# Patient Record
Sex: Female | Born: 1985 | Race: White | Hispanic: No | Marital: Single | Smoking: Light tobacco smoker
Health system: Southern US, Community
[De-identification: ages and names within clinical notes are randomized; demographics above are authoritative.]

## PROBLEM LIST (undated history)

## (undated) DIAGNOSIS — R51 Headache: Secondary | ICD-10-CM

## (undated) DIAGNOSIS — T7840XA Allergy, unspecified, initial encounter: Secondary | ICD-10-CM

## (undated) DIAGNOSIS — R112 Nausea with vomiting, unspecified: Secondary | ICD-10-CM

## (undated) DIAGNOSIS — K219 Gastro-esophageal reflux disease without esophagitis: Secondary | ICD-10-CM

## (undated) DIAGNOSIS — C4490 Unspecified malignant neoplasm of skin, unspecified: Secondary | ICD-10-CM

## (undated) DIAGNOSIS — B958 Unspecified staphylococcus as the cause of diseases classified elsewhere: Secondary | ICD-10-CM

## (undated) DIAGNOSIS — Z8619 Personal history of other infectious and parasitic diseases: Secondary | ICD-10-CM

## (undated) DIAGNOSIS — F419 Anxiety disorder, unspecified: Secondary | ICD-10-CM

## (undated) DIAGNOSIS — Z9889 Other specified postprocedural states: Secondary | ICD-10-CM

## (undated) DIAGNOSIS — I10 Essential (primary) hypertension: Secondary | ICD-10-CM

## (undated) HISTORY — DX: Unspecified malignant neoplasm of skin, unspecified: C44.90

## (undated) HISTORY — DX: Allergy, unspecified, initial encounter: T78.40XA

## (undated) HISTORY — DX: Gastro-esophageal reflux disease without esophagitis: K21.9

## (undated) HISTORY — PX: OTHER SURGICAL HISTORY: SHX169

## (undated) HISTORY — DX: Headache: R51

## (undated) HISTORY — PX: GASTRIC ROUX-EN-Y: SHX5262

## (undated) HISTORY — PX: FRACTURE SURGERY: SHX138

## (undated) HISTORY — DX: Personal history of other infectious and parasitic diseases: Z86.19

---

## 2005-10-03 DIAGNOSIS — C4490 Unspecified malignant neoplasm of skin, unspecified: Secondary | ICD-10-CM

## 2005-10-03 HISTORY — DX: Unspecified malignant neoplasm of skin, unspecified: C44.90

## 2010-02-16 ENCOUNTER — Ambulatory Visit: Payer: Self-pay | Admitting: Family

## 2010-02-16 DIAGNOSIS — J45909 Unspecified asthma, uncomplicated: Secondary | ICD-10-CM | POA: Insufficient documentation

## 2010-02-16 DIAGNOSIS — G43009 Migraine without aura, not intractable, without status migrainosus: Secondary | ICD-10-CM | POA: Insufficient documentation

## 2010-02-16 LAB — CONVERTED CEMR LAB
AST: 11 units/L (ref 0–37)
Alkaline Phosphatase: 91 units/L (ref 39–117)
BUN: 19 mg/dL (ref 6–23)
Basophils Absolute: 0 10*3/uL (ref 0.0–0.1)
Basophils Relative: 1 % (ref 0–1)
Calcium: 8.7 mg/dL (ref 8.4–10.5)
Chloride: 108 meq/L (ref 96–112)
Creatinine, Ser: 0.69 mg/dL (ref 0.40–1.20)
Eosinophils Absolute: 0.3 10*3/uL (ref 0.0–0.7)
Eosinophils Relative: 4 % (ref 0–5)
HCT: 41.8 % (ref 36.0–46.0)
HDL: 45 mg/dL (ref >39)
MCHC: 31.8 g/dL (ref 30.0–36.0)
MCV: 89.5 fL (ref 78.0–100.0)
Neutrophils Relative %: 61 % (ref 43–77)
Platelets: 308 10*3/uL (ref 150–400)
RDW: 13.7 % (ref 11.5–15.5)
TSH: 2.225 microintl units/mL (ref 0.350–4.500)
Total Bilirubin: 0.4 mg/dL (ref 0.3–1.2)
Total CHOL/HDL Ratio: 2.7
VLDL: 12 mg/dL (ref 0–40)

## 2010-02-17 ENCOUNTER — Encounter: Payer: Self-pay | Admitting: Family

## 2010-02-23 ENCOUNTER — Encounter: Payer: Self-pay | Admitting: Family

## 2010-03-03 ENCOUNTER — Ambulatory Visit: Payer: Self-pay | Admitting: Family

## 2010-05-19 ENCOUNTER — Ambulatory Visit: Payer: Self-pay | Admitting: Family

## 2010-05-19 DIAGNOSIS — L732 Hidradenitis suppurativa: Secondary | ICD-10-CM

## 2010-08-27 ENCOUNTER — Emergency Department (HOSPITAL_COMMUNITY): Admission: EM | Admit: 2010-08-27 | Discharge: 2010-08-28 | Payer: Self-pay | Admitting: Emergency Medicine

## 2010-11-02 NOTE — Assessment & Plan Note (Signed)
Summary: TO EST /HA/hea   Vital Signs:  Patient profile:   25 year old female Height:      66.5 inches Weight:      242.75 pounds BMI:     38.73 Temp:     98.3 degrees F oral Pulse rate:   78 / minute Pulse rhythm:   regular Resp:     12 per minute BP sitting:   138 / 80  (right arm) Cuff size:   large  Vitals Entered By: Mervin Kung CMA (Feb 16, 2010 8:33 AM) CC: room 4  Intermittent headaches for years. Having more frequent and intense headaches., Headache Is Patient Diabetic? No Comments Pt will be completing the Gardasil vaccine in June, 2011.   CC:  room 4  Intermittent headaches for years. Having more frequent and intense headaches. and Headache.  History of Present Illness: Patient has been going to [facility] previously.    1) Headaches-  notes chronic hx of headaches.  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  Last Monday had severe headache,  Thursday had another severe headache.  Friday no heaache.  Sever headache on Sunday - was in tears, took 4 Aleve without improvement.  Reports "head doesn't feel right."  10AM yesterday developed contstant HA all day.  HA sometime start behind her eyes/temples.  Other times localize behind the forehead.  Sunday had associated nausea with  headache.  + Photophobia.  Denies phonophobia.  Grandmother has history of severe HA's and migraines.     2) Obesity-  has been working out has lost 10 pounds over last 3 months.  Exercising regularly, Wants help with weight loss.  Wants to go to the weight loss center at [facility]  3) Asthma-  recently got prescription coverage.  She notes that she had been using Singulair and Symbicort sparingly and not on a regular basis due to cost.  Approximately 1 month ago she started using regularly due to flare up of her symptoms.  Presently, she reports that she is rarely needing her Albuterol MDI.   Headache HPI:      Headache quality is pressure or tightness.   Aggravating factors include walking up/down stairs and physical activity.        Positive alarm features include change in frequency from prior H/A's, change in severity from prior H/A's, weight loss, and scalp tenderness.  The patient denies first or worst H/A of life, change in features from prior H/A's, H/A's with Valsalva (cough/sneeze), fever, confusion, and seizures.        Preventive Screening-Counseling & Management  Alcohol-Tobacco     Alcohol drinks/day: <1     Alcohol type: wine     Smoking Status: never  Caffeine-Diet-Exercise     Caffeine use/day: 3-4 cups coffee daily     Does Patient Exercise: yes     Type of exercise: cardio, weights     Exercise (avg: min/session): 30-60     Times/week: 3      Drug Use:  no.    Allergies (verified): No Known Drug Allergies  Past History:  Past Medical History: Asthma chicken Pox Frequent Headaches GERD HIV testing--2006 Allergies Skin cancer--2007  Past Surgical History: Fracture right elbow--childhood  Family History: Uterine  Cancer--mother Hypercholesterolemia--both grandfathers HTN--father, both grandfathers diabetes--both grandfathers, mother MI--both grandfathers; deceased  Mom- DM2, ESRD, HTN, Uterine cancer, Carpal Tunnel, overweight Dad- HTN, untreated,  Sister- healthy.  Not overweight  Social History: Single Works at [employer]  [place] (works in [employer]) Never Smoked Alcohol use-no Drug use-no Regular exercise-yes Smoking Status:  never Caffeine use/day:  3-4 cups coffee daily Does Patient Exercise:  yes Drug Use:  no  Review of Systems       Constitutional: Denies Fever ENT:  Denies nasal congestion or sore throat. Resp: Denies cough CV:  Denies Chest Pain GI:  Denies nausea or vomitting GU: Denies dysuria Lymphatic: Denies lymphadenopathy Musculoskeletal:  Denies muscle/joint pain Skin:  Denies Rashes Psychiatric: Denies depression Neuro: Denies numbness     Physical  Exam  General:  Well-developed,well-nourished,in no acute distress; alert,appropriate and cooperative throughout examination Head:  Normocephalic and atraumatic without obvious abnormalities. No apparent alopecia or balding. Eyes:  PERRLA Neck:  No deformities, masses, or tenderness noted. Lungs:  Normal respiratory effort, chest expands symmetrically. Lungs are clear to auscultation, no crackles or wheezes. Heart:  Normal rate and regular rhythm. S1 and S2 normal without gallop, murmur, click, rub or other extra sounds. Neurologic:  alert & oriented X3, cranial nerves II-XII intact, strength normal in all extremities, gait normal, and DTRs symmetrical and normal.     Impression & Recommendations:  Problem # 1:  COMMON MIGRAINE (ICD-346.10) Assessment Unchanged Will give patient trial of Sumatripan.  If no improvement consider referral to Headache Clinic The following medications were removed from the medication list:    Sumatriptan Succinate 50 Mg Tabs (Sumatriptan succinate) ..... One tablet by mouth x 1 at start of migraine.  may repeat in 2 hours if needed Her updated medication list for this problem includes:    Sumatriptan Succinate 50 Mg Tabs (Sumatriptan succinate) .Marland Kitchen... Take one tablet at start of headache, may repeat in 2 hours as needed  Problem # 2:  MORBID OBESITY (ICD-278.01) Assessment: Unchanged  Patient is frustrated with lack of weight loss.  Feels like she has been working hard on diet and exercise.  Feels like she needs help. Will refer to the Weight Loss Clinic.  Plan to check fasing labs today which will include TSH.  Pt to follow up for complete physical in 2 weeks. Will attempt to obtain records from [facility].  Orders: Misc. Referral (Misc. Ref)  Problem # 3:  ASTHMA, MODERATE (ICD-493.90) Assessment: Unchanged Per patient her prescriber recommeded  Advair as this is on their formulary.  Sample given of Advair lot [id number] exp- 06/2021 #2.  Will  start Advair in place of Symbicort.  She is also requesting generic for Singulair.  Rx given for Zafirlukast.  Continue Proair PRN The following medications were removed from the medication list:    Symbicort 160-4.5 Mcg/act Aero (Budesonide-formoterol fumarate) .Marland Kitchen... 2 puffs one to two times daily    Singulair 10 Mg Tabs (Montelukast sodium) .Marland Kitchen... Take 1 tablet by mouth once a day    Proair Hfa 108 (90 Base) Mcg/act Aers (Albuterol sulfate) .Marland Kitchen... 1-2puffs as needed. Her updated medication list for this problem includes:    Zafirlukast 20 Mg Tabs (Zafirlukast) .Marland Kitchen... 0ne tablet by mouth twice daily    Advair Diskus 250-50 Mcg/dose Aepb (Fluticasone-salmeterol) ..... One puff twice daily    Proair Hfa 108 (90 Base) Mcg/act Aers (Albuterol sulfate) .Marland Kitchen... 2 puffs every 6 hours as needed  Complete Medication List: 1)  Prilosec Otc 20 Mg Tbec (Omeprazole magnesium) .... Take 1 tablet by mouth once a day 2)  Depo-provera 150 Mg/ml Susp (Medroxyprogesterone acetate) .Marland Kitchen.. 1 injection every 3 months 3)  Zafirlukast 20 Mg Tabs (Zafirlukast) .... 0ne tablet by mouth twice daily  4)  Advair Diskus 250-50 Mcg/dose Aepb (Fluticasone-salmeterol) .... One puff twice daily 5)  Proair Hfa 108 (90 Base) Mcg/act Aers (Albuterol sulfate) .... 2 puffs every 6 hours as needed 6)  Sumatriptan Succinate 50 Mg Tabs (Sumatriptan succinate) .... Take one tablet at start of headache, may repeat in 2 hours as needed  Other Orders: T-Comprehensive Metabolic Panel ([id number]) T-CBC w/Diff ([id number]) T-Lipid Profile ([id number]) T-TSH ([id number])  Patient Instructions: 1)  Please follow up in 2 weeks for a complete physical.  Come fasting to this appointment. 2)  You will be called about your referral to the Weight Loss Center.   3)  It was a pleasure to meet you, welcome to [place]! Prescriptions: SUMATRIPTAN SUCCINATE 50 MG TABS (SUMATRIPTAN SUCCINATE) take one tablet at start of headache, may repeat in 2 hours  as needed  #6 x 0   Entered and Authorized by:   Lemont Fillers FNP   Signed by:   Lemont Fillers FNP on 02/16/2010   Method used:   [facility] [phone][facility] (retail[facility]       [address]       [place], [place]  [address]       Ph: [phone]       Fax: [phone]   RxID:   [id number] SUMATRIPTAN SUCCINATE 50 MG TABS (SUMATRIPTAN SUCCINATE) one tablet by mouth x 1 at start of migraine.  May repeat in 2 hours if needed  #18 x 0   Entered and Authorized by:   Lemont Fillers FNP   Signed by:   Lemont Fillers FNP on 02/16/2010   Method used:   Electronically to        [facility]* (mail-order)             ,          Ph: [phone]       Fax: [phone]   RxID:   [id number] PROAIR HFA 108 (90 BASE) MCG/ACT AERS (ALBUTEROL SULFATE) 2 puffs every 6 hours as needed  #3 x 1   Entered and Authorized by:   Lemont Fillers FNP   Signed by:   Lemont Fillers FNP on 02/16/2010   Method used:   Electronically to        [facility]* (mail-order)             ,          Ph: [phone]       Fax: [phone]   RxID:   [id number] ADVAIR DISKUS 250-50 MCG/DOSE AEPB (FLUTICASONE-SALMETEROL) one puff twice daily  #3 x 1   Entered and Authorized by:   Lemont Fillers FNP   Signed by:   Lemont Fillers FNP on 02/16/2010   Method used:   Electronically to        [facility]* (mail-order)             ,          Ph: [phone]       Fax: [phone]   RxID:   [id number] ZAFIRLUKAST 20 MG TABS (ZAFIRLUKAST) 0ne tablet by mouth twice daily  #90 x 1   Entered and Authorized by:   Lemont Fillers FNP   Signed by:   Lemont Fillers FNP on 02/16/2010   Method used:   Electronically to  [facility]* (mail-order)             ,          Ph: [phone]       Fax: [phone]   RxID:   [id number]   Current Allergies (reviewed today): No known  allergies    Immunization History:  Influenza Immunization History:    Influenza:  historical (10/05/2009)    Preventive Care Screening  Pap Smear:    Date:  10/05/2009    Results:  normal

## 2010-11-02 NOTE — Assessment & Plan Note (Signed)
Summary: CPX NO PAP/DT   Vital Signs:  Patient profile:   25 year old female Height:      66.5 inches Weight:      239 pounds BMI:     38.14 Temp:     98.7 degrees F oral Pulse rate:   84 / minute Pulse rhythm:   regular Resp:     16 per minute BP sitting:   120 / 66  (right arm) Cuff size:   large  Vitals Entered By: Mervin Kung CMA (March 03, 2010 3:54 PM) CC: room 5   Physical. Is Patient Diabetic? No   CC:  room 5   Physical..  History of Present Illness: Miranda Mccoy is a 25 year old female who presents today for a complete physical.  Last tetanus was greater than 10 years ago.  Patient is exercising daily (cardio 55 minutes total on Eliptical and treadmill).  Patient is eating healthy, has started phentermine throught the weight loss clinic at [facility]. Eating less.  Has lost 4 pounds in the last week. Reports that her asthma is well controlled.  Has had one migraine since last visit which was relieved by sumatripan.  Reports 2 sexual partners in the last 6 months.   Allergies (verified): No Known Drug Allergies  Past History:  Past Medical History: Asthma chicken Pox Frequent Headaches GERD HIV testing--2006 Allergies Skin cancer--2007 [id number]  Review of Systems       Constitutional: Denies Fever ENT:  +clear sinus drainage, denies sore throat. Resp: Denies cough CV:  Denies Chest Pain or SOB GI:  Denies vomitting, mild nausea due to sinus drainage.   GU: Denies dysuria Lymphatic: Denies lymphadenopathy Musculoskeletal:  Denies muscle/joint pain Skin:  Denies Rashes Psychiatric: Denies depression,  notes some anxiety- but does does not wish to start med for this Neuro: Denies numbness     Physical Exam  General:  Well-developed,well-nourished,in no acute distress; alert,appropriate and cooperative throughout examination Head:  Normocephalic and atraumatic without obvious abnormalities. No apparent alopecia or balding. Eyes:   Perrla Ears:  External ear exam shows no significant lesions or deformities.  Otoscopic examination reveals clear canals, tympanic membranes are intact bilaterally without bulging, retraction, inflammation or discharge. Hearing is grossly normal bilaterally. Mouth:  Oral mucosa and oropharynx without lesions or exudates.  Teeth in good repair. Neck:  No deformities, masses, or tenderness noted. Breasts:  deferred to GYN Lungs:  Normal respiratory effort, chest expands symmetrically. Lungs are clear to auscultation, no crackles or wheezes. Heart:  Normal rate and regular rhythm. S1 and S2 normal without gallop, murmur, click, rub or other extra sounds. Abdomen:  Bowel sounds positive,abdomen soft and non-tender without masses, organomegaly or hernias noted. Genitalia:  deferred to GYN Msk:  No deformity or scoliosis noted of thoracic or lumbar spine.   Pulses:  R and L carotid,radial,femoral,dorsalis pedis and posterior tibial pulses are full and equal bilaterally Extremities:  No clubbing, cyanosis, edema, or deformity noted with normal full range of motion of all joints.   Neurologic:  No cranial nerve deficits noted. Station and gait are normal. Plantar reflexes are down-going bilaterally. DTRs are symmetrical throughout. Sensory, motor and coordinative functions appear intact. Skin:  + hemagioma noted on left anterior arm.  Hirsuit Cervical Nodes:  No lymphadenopathy noted Psych:  Cognition and judgment appear intact. Alert and cooperative with normal attention span and concentration. No apparent delusions, illusions, hallucinations   Impression & Recommendations:  Problem # 1:  PREVENTIVE HEALTH  CARE (ICD-V70.0) Assessment Comment Only Patient encouraged to keep up the good work with diet and exercise.  Immunizations reviewed,  patient counseled on BSE. Reviewed laboratory results with the patient.  Up to date on Pap smear.  Patient follows with GYN.   Complete Medication List: 1)   Prilosec Otc 20 Mg Tbec (Omeprazole magnesium) .... Take 1 tablet by mouth once a day 2)  Depo-provera 150 Mg/ml Susp (Medroxyprogesterone acetate) .Marland Kitchen.. 1 injection every 3 months 3)  Zafirlukast 20 Mg Tabs (Zafirlukast) .... 0ne tablet by mouth twice daily 4)  Advair Diskus 250-50 Mcg/dose Aepb (Fluticasone-salmeterol) .... One puff twice daily 5)  Proair Hfa 108 (90 Base) Mcg/act Aers (Albuterol sulfate) .... 2 puffs every 6 hours as needed 6)  Sumatriptan Succinate 50 Mg Tabs (Sumatriptan succinate) .... Take one tablet at start of headache, may repeat in 2 hours as needed 7)  Phentermine Hcl 37.5 Mg Tabs (Phentermine hcl) .... Take 1 tablet by mouth once a day  Patient Instructions: 1)  Keep up the good work with the diet and exercise.   2)  Follow up in 3 months.  Current Allergies (reviewed today): No known allergies

## 2010-11-02 NOTE — Consult Note (Signed)
Summary: [facility]  [facility]   Imported By: Lanelle Bal 03/31/2010 08:16:30  _____________________________________________________________________  External Attachment:    Type:   Image     Comment:   External Document

## 2010-11-02 NOTE — Letter (Signed)
   [place] [facility] [address] [place], [place]  [address]  [place] Phone: [phone]      Feb 17, 2010   [facility] [address] [place], [place] [address]  RE:  LAB RESULTS  Dear  Miranda Mccoy,  The following is an interpretation of your most recent lab tests.  Please take note of any instructions provided or changes to medications that have resulted from your lab work.  ELECTROLYTES:  Good - no changes needed  KIDNEY FUNCTION TESTS:  Good - no changes needed  LIVER FUNCTION TESTS:  Good - no changes needed  LIPID PANEL:  Good - no changes needed Triglyceride: 61   Cholesterol: 122   LDL: 65   HDL: 45   Chol/HDL%:  2.7 Ratio  THYROID STUDIES:  Thyroid studies normal TSH: 2.225     DIABETIC STUDIES:  Excellent - no changes needed Blood Glucose: 83    CBC:  Good - no changes needed  Please follow up in June as scheduled for your physical.     Sincerely Yours,    Lemont Fillers FNP

## 2010-11-02 NOTE — Assessment & Plan Note (Signed)
Summary: lump under her arm/mhf   Vital Signs:  Patient profile:   25 year old female Weight:      224.75 pounds BMI:     35.86 O2 Sat:      99 % on Room air Temp:     97.8 degrees F oral Pulse rate:   109 / minute Pulse rhythm:   regular Resp:     20 per minute BP sitting:   134 / 90  (right arm) Cuff size:   large  Vitals Entered By: Miranda Mccoy CMA (May 19, 2010 4:09 PM)  O2 Flow:  Room air CC: Lump under right arm Is Patient Diabetic? No Pain Assessment Patient in pain? no      Comments quarter size lump under right arm, has gone down some, denies temp, feels like a bruise to touch, present for the past week and a half   CC:  Lump under right arm.  History of Present Illness: Miranda Mccoy is a 25 year old female who presents with a 10 day history of tender "bump" under her right arm.  Denies associated drainage, denies fever.  Preventive Screening-Counseling & Management  Alcohol-Tobacco     Smoking Status: never  Allergies (verified): No Known Drug Allergies  Physical Exam  General:  Well-developed,well-nourished,in no acute distress; alert,appropriate and cooperative throughout examination Skin:  Tender, firm, marble sized nodule noted in right axilla, no drainage, no open area.  Psych:  Oriented X3 and memory intact for recent and remote.     Impression & Recommendations:  Problem # 1:  HIDRADENITIS (ICD-705.83) Assessment New Will treat with doxycycline.  Patient instructed to f/u as noted in instructions.  Complete Medication List: 1)  Prilosec Otc 20 Mg Tbec (Omeprazole magnesium) .... Take 1 tablet by mouth once a day 2)  Depo-provera 150 Mg/ml Susp (Medroxyprogesterone acetate) .Marland Kitchen.. 1 injection every 3 months 3)  Zafirlukast 20 Mg Tabs (Zafirlukast) .... 0ne tablet by mouth twice daily 4)  Advair Diskus 250-50 Mcg/dose Aepb (Fluticasone-salmeterol) .... One puff twice daily 5)  Proair Hfa 108 (90 Base) Mcg/act Aers (Albuterol sulfate) .... 2  puffs every 6 hours as needed 6)  Sumatriptan Succinate 50 Mg Tabs (Sumatriptan succinate) .... Take one tablet at start of headache, may repeat in 2 hours as needed 7)  Phentermine Hcl 37.5 Mg Tabs (Phentermine hcl) .... Take 1 tablet by mouth once a day 8)  Hydrochlorothiazide 25 Mg Tabs (Hydrochlorothiazide) .... Take 1 tablet by mouth once a day 9)  Doxycycline Hyclate 100 Mg Caps (Doxycycline hyclate) .... One cap by mouth two times a day x 7 days  Patient Instructions: 1)  Please call if your symptoms worsen or if it does not improve.   Prescriptions: DOXYCYCLINE HYCLATE 100 MG CAPS (DOXYCYCLINE HYCLATE) one cap by mouth two times a day x 7 days  #14 x 0   Entered and Authorized by:   Miranda Fillers FNP   Signed by:   Miranda Fillers FNP on 05/19/2010   Method used:   [facility]        [facility] [phone]* (retail)       [address]       [place], [place]  [address]       Ph: [phone]       Fax: [phone]   RxID:   [id number]     Current Allergies (reviewed today): No known allergies

## 2010-12-28 ENCOUNTER — Encounter: Payer: Self-pay | Admitting: Family

## 2010-12-28 ENCOUNTER — Ambulatory Visit (INDEPENDENT_AMBULATORY_CARE_PROVIDER_SITE_OTHER): Payer: 59 | Admitting: Family

## 2010-12-28 DIAGNOSIS — G43909 Migraine, unspecified, not intractable, without status migrainosus: Secondary | ICD-10-CM

## 2010-12-28 DIAGNOSIS — G43009 Migraine without aura, not intractable, without status migrainosus: Secondary | ICD-10-CM

## 2010-12-28 MED ORDER — SUMATRIPTAN SUCCINATE 50 MG PO TABS: ORAL_TABLET | ORAL | Status: DC

## 2010-12-28 MED ORDER — TOPIRAMATE 25 MG PO TABS: 25.0000 mg | ORAL_TABLET | Freq: Two times a day (BID) | ORAL | Status: DC

## 2010-12-28 NOTE — Patient Instructions (Signed)
Please call if your symptoms worsen or do not improve.  Follow up in 2 weeks.

## 2010-12-28 NOTE — Progress Notes (Signed)
  Subjective:    Patient ID: Miranda Mccoy, female    DOB: July 17, 1986, 25 y.o.   MRN: [id number]  HPI HA- imitrex has dulled her HA.  3 week hx of frontal headaches.  Sharp pains.  Affecting her job.  Last night headache was so bad, that "I almost went to the emergency room."  Normally I just deal with it.  + associated photophobia, + phonophobia.  +associated nausea- no vomitting.  She is still on depo.  Recently started back on phentermine.    Review of Systems See HPI    Objective:   Physical Exam  Constitutional: She is oriented to person, place, and time. She appears well-developed and well-nourished.  Eyes: Pupils are equal, round, and reactive to light.  Cardiovascular: Normal rate and regular rhythm.   Pulmonary/Chest: Effort normal and breath sounds normal.  Neurological: She is alert and oriented to person, place, and time. She displays normal reflexes. She exhibits normal muscle tone. Coordination normal.          Assessment & Plan:

## 2010-12-30 ENCOUNTER — Telehealth: Payer: Self-pay | Admitting: *Deleted

## 2010-12-30 NOTE — Telephone Encounter (Signed)
Received voice message from pt stating imitrex is not getting rid of headache, feels miserable, can't focus at work. Has had HA since 12/24/10. Called on call nurse last night. Wants to know what else can be done?  Please advise.  Triage Record Num: [id number] Operator: Tomasita Crumble Patient Name: Miranda Mccoy Call Date & Time: 12/29/2010 9:51:24PM Patient Phone: [phone] PCP: Sandford Craze, NP Patient Gender: Female PCP Fax : [phone] Patient DOB: May 16, 1986 Practice Name: [facility] [place] [facility] Reason for Call: Pt. calling about headache "coming in waves". Onset 3/28 am - "head did not feel right"; continuous dull headache onset 3/23. Pain up to 7 of 10, currently 3-4 of 10. LMP - on depoprovera; unknown. Advised see MD in 24 hours per Headache protocol, home measures for the interim. Protocol(s) Used: Headache Recommended Outcome per Protocol: See Provider within 24 hours Reason for Outcome: Typical headache AND usual therapy is not available or is not working Care Advice: ~ Another adult should drive. ~ Avoid known causes and factors that trigger headaches. ~ Do not skip or delay meals, unless vomiting. ~ Call provider if symptoms worsen or new symptoms develop. Call EMS 911 immediately if any of the following occur: any loss of consciousness; new confusion, drowsiness or agitation; difficulty speaking; new weakness or paralysis, severe numbness, or difficulty moving. ~ ~ List, or take, all current prescription(s), nonprescription or alternative medication(s) to provider for evaluation. Most adults need to drink 6-10 eight-ounce glasses (1.2-2.0 liters) of fluids per day unless previously told to limit fluid intake for other medical reasons. Limit fluids that contain caffeine, sugar or alcohol. Urine will be a very light yellow color when you drink enough fluids. ~ Analgesic/Antipyretic Advice - Acetaminophen: Consider acetaminophen as directed on label or by  pharmacist/provider for pain or fever PRECAUTIONS: - Use if there is no history of liver disease, alcoholism, or intake of three or more alcohol drinks per day - Only if approved by provider during pregnancy or when breastfeeding - During pregnancy, acetaminophen should not be taken more than 3 consecutive days without telling provider - Do not exceed recommended dose or frequency ~ Analgesic/Antipyretic Advice - NSAIDs: Consider aspirin, ibuprofen, naproxen or ketoprofen for pain or fever as directed on label or by pharmacist/provider. PRECAUTIONS: - If over 64 years of age, should not take longer than 1 week without consulting provider. EXCEPTIONS: - Should not be used if taking blood thinners or have bleeding problems. - Do not use if have history of sensitivity/allergy to any of these medications; or history of cardiovascular, ulcer, kidney, liver disease or diabetes unless approved by provider. - Do not exceed recommended dose or frequency. ~ Migraine Self Care: - At the first sign of a migraine apply a cold cloth or cloth-covered ice pack to your head or the back of the neck. - Apply pressure or massage scalp and temples. - Lie down in a quiet, dark room for several hours. - Minimize noise, light, and odors, especially cooking and tobacco odors. - Do not read or use a computer.

## 2010-12-31 ENCOUNTER — Telehealth: Payer: Self-pay | Admitting: *Deleted

## 2010-12-31 ENCOUNTER — Telehealth: Payer: Self-pay | Admitting: Family

## 2010-12-31 DIAGNOSIS — G43919 Migraine, unspecified, intractable, without status migrainosus: Secondary | ICD-10-CM

## 2010-12-31 MED ORDER — TRAMADOL HCL 50 MG PO TABS: 50.0000 mg | ORAL_TABLET | Freq: Four times a day (QID) | ORAL | Status: AC | PRN

## 2010-12-31 NOTE — Telephone Encounter (Signed)
Yes tramadol is in addition to her to her topamax and imitrex.  Per protocol, we should repeat her pregnancy test prior to CT.

## 2010-12-31 NOTE — Telephone Encounter (Signed)
Received voicemail from pt that Tramadol rx went to [facility] in error; [facility] [address]. [facility] [place] [facility] per previous phone note. Detailed voice message left on pt's machine regarding correction and to call if any questions.

## 2010-12-31 NOTE — Telephone Encounter (Signed)
Please call patient and let her know that I have sent rx to her pharmacy for tramadol.  (do not drive while taking that med.)  I have also placed order for a CT of her head and [facility].  She will need nurse visit for hcg check prior to CT scan.

## 2010-12-31 NOTE — Assessment & Plan Note (Signed)
Will give patient a trial of Topamax.  Continue with imitrex PRN.  If no improvement- consider referral to headache specialist.

## 2010-12-31 NOTE — Telephone Encounter (Signed)
Advised pt per Melissa's instructions. Pt wants to know if the Tramadol is in addition to her other migraine meds or does it replace one? Also states she had a negative home pregnancy test last week. Wants to know if it is still necessary to due prior to the CT scan? Please advise.

## 2010-12-31 NOTE — Telephone Encounter (Signed)
Advised pt per Melissa's instruction. Pt voices understanding and has been advised that she needs to have CT scan before next Friday.

## 2011-01-03 ENCOUNTER — Ambulatory Visit
Admission: RE | Admit: 2011-01-03 | Discharge: 2011-01-03 | Disposition: A | Discharge status: Home / Self Care | Payer: 59 | Source: Ambulatory Visit | Attending: Family | Admitting: Family

## 2011-01-03 ENCOUNTER — Telehealth: Payer: Self-pay | Admitting: Family

## 2011-01-03 ENCOUNTER — Other Ambulatory Visit: Payer: 59

## 2011-01-03 DIAGNOSIS — G43919 Migraine, unspecified, intractable, without status migrainosus: Secondary | ICD-10-CM

## 2011-01-03 NOTE — Telephone Encounter (Signed)
Please call patient and let her know that her MRI is normal.

## 2011-01-03 NOTE — Telephone Encounter (Signed)
Pt.notified

## 2011-01-17 ENCOUNTER — Ambulatory Visit: Payer: 59 | Admitting: Family

## 2014-08-01 DIAGNOSIS — N898 Other specified noninflammatory disorders of vagina: Secondary | ICD-10-CM | POA: Insufficient documentation

## 2014-08-01 DIAGNOSIS — N760 Acute vaginitis: Secondary | ICD-10-CM

## 2014-08-01 DIAGNOSIS — B9689 Other specified bacterial agents as the cause of diseases classified elsewhere: Secondary | ICD-10-CM | POA: Insufficient documentation

## 2014-08-01 DIAGNOSIS — R3915 Urgency of urination: Secondary | ICD-10-CM | POA: Insufficient documentation

## 2014-08-01 DIAGNOSIS — R350 Frequency of micturition: Secondary | ICD-10-CM | POA: Insufficient documentation

## 2016-02-01 DIAGNOSIS — E042 Nontoxic multinodular goiter: Secondary | ICD-10-CM | POA: Insufficient documentation

## 2016-12-07 ENCOUNTER — Emergency Department (HOSPITAL_COMMUNITY)
Admission: EM | Admit: 2016-12-07 | Discharge: 2016-12-07 | Disposition: A | Discharge status: Home / Self Care | Payer: 59 | Attending: Emergency Medicine | Admitting: Emergency Medicine

## 2016-12-07 ENCOUNTER — Emergency Department (HOSPITAL_COMMUNITY): Payer: 59

## 2016-12-07 ENCOUNTER — Encounter (HOSPITAL_COMMUNITY): Payer: Self-pay

## 2016-12-07 DIAGNOSIS — F17210 Nicotine dependence, cigarettes, uncomplicated: Secondary | ICD-10-CM | POA: Diagnosis not present

## 2016-12-07 DIAGNOSIS — Z85828 Personal history of other malignant neoplasm of skin: Secondary | ICD-10-CM | POA: Insufficient documentation

## 2016-12-07 DIAGNOSIS — Z79899 Other long term (current) drug therapy: Secondary | ICD-10-CM | POA: Diagnosis not present

## 2016-12-07 DIAGNOSIS — R9431 Abnormal electrocardiogram [ECG] [EKG]: Secondary | ICD-10-CM | POA: Diagnosis not present

## 2016-12-07 DIAGNOSIS — J45909 Unspecified asthma, uncomplicated: Secondary | ICD-10-CM | POA: Diagnosis not present

## 2016-12-07 HISTORY — DX: Anxiety disorder, unspecified: F41.9

## 2016-12-07 LAB — CBC
HCT: 43.5 % (ref 36.0–46.0)
HEMOGLOBIN: 14.5 g/dL (ref 12.0–15.0)
MCH: 30.8 pg (ref 26.0–34.0)
MCHC: 33.3 g/dL (ref 30.0–36.0)
MCV: 92.4 fL (ref 78.0–100.0)
PLATELETS: 249 10*3/uL (ref 150–400)
RBC: 4.71 MIL/uL (ref 3.87–5.11)
RDW: 13.6 % (ref 11.5–15.5)
WBC: 6.7 10*3/uL (ref 4.0–10.5)

## 2016-12-07 LAB — BASIC METABOLIC PANEL
Anion gap: 10 (ref 5–15)
BUN: 12 mg/dL (ref 6–20)
CALCIUM: 8.9 mg/dL (ref 8.9–10.3)
CHLORIDE: 104 mmol/L (ref 101–111)
CO2: 25 mmol/L (ref 22–32)
CREATININE: 0.75 mg/dL (ref 0.44–1.00)
GFR calc non Af Amer: >60 mL/min (ref >60)
Glucose, Bld: 90 mg/dL (ref 65–99)
Potassium: 4 mmol/L (ref 3.5–5.1)
SODIUM: 139 mmol/L (ref 135–145)

## 2016-12-07 LAB — I-STAT TROPONIN, ED: TROPONIN I, POC: 0 ng/mL (ref 0.00–0.08)

## 2016-12-07 MED ORDER — MAGNESIUM SULFATE 2 GM/50ML IV SOLN
2.0000 g | Freq: Once | INTRAVENOUS | Status: AC
  Administered 2016-12-07: 2 g via INTRAVENOUS
  Filled 2016-12-07: qty 50

## 2016-12-07 NOTE — ED Triage Notes (Signed)
Pt. Coming from [facility] PCP office for abnormal EKG. Pt. Went in for check up on her weight loss program. Pt. Taking phenteramine? For weight loss and took a couple pills this weekend. RN noticed pt. Pulse low and did EKG. EKG noted 5-6 beat runs of vtach. Pt. Converted to NSR en route with no interventions. Pt. Also c/o dizziness intermittently since November. Pt. Aox4 and denies any chest pains.

## 2016-12-07 NOTE — ED Notes (Signed)
Patient transported to x-ray. ?

## 2016-12-07 NOTE — ED Notes (Signed)
Pt HR normal and pt stable for discharge, will follow up with cardiology

## 2016-12-07 NOTE — ED Provider Notes (Signed)
[facility] DEPT Provider Note   CSN: [id number] Arrival date & time: 12/07/16  1156     History   Chief Complaint No chief complaint on file.   HPI Miranda Mccoy is a 31 y.o. female.  HPI 31 y.o. female , presents to the Emergency Department today due to abnormal ECG at PCP office for routine weight loss assessment. Noted V Tach 5-6 runs in office on ECG. No hx same. Spontaneously converted en route to ED with EMS. No meds given. Pt notes mild chest pressure. No SOB. No N/V/D. No diaphoresis. Notes intermittent dizziness and lightheadedness x several months since starting on phentermine. No fevers. No URI symptoms. No pain currently. No other symptoms noted.   Past Medical History:  Diagnosis Date  . Allergy   . Asthma   . GERD (gastroesophageal reflux disease)   . Headache   . History of chicken pox   . Skin cancer 2007    Patient Active Problem List   Diagnosis Date Noted  . HIDRADENITIS 05/19/2010  . MORBID OBESITY 02/16/2010  . COMMON MIGRAINE 02/16/2010  . ASTHMA, MODERATE 02/16/2010    Past Surgical History:  Procedure Laterality Date  . FRACTURE SURGERY     elbow as child    OB History    No data available       Home Medications    Prior to Admission medications   Medication Sig Start Date End Date Taking? Authorizing Provider  albuterol (PROAIR HFA) 108 (90 BASE) MCG/ACT inhaler Inhale 2 puffs into the lungs every 6 (six) hours as needed.      Historical Provider, MD  budesonide-formoterol (SYMBICORT) 160-4.5 MCG/ACT inhaler Inhale 1 puff into the lungs 2 (two) times daily.      Historical Provider, MD  hydrochlorothiazide 25 MG tablet Take 25 mg by mouth daily.      Historical Provider, MD  medroxyPROGESTERone (DEPO-PROVERA) 150 MG/ML injection Inject 150 mg into the muscle every 3 (three) months.      Historical Provider, MD  montelukast (SINGULAIR) 10 MG tablet Take 10 mg by mouth daily.      Historical Provider, MD  omeprazole (PRILOSEC OTC)  20 MG tablet Take 20 mg by mouth daily.      Historical Provider, MD  phentermine 37.5 MG capsule Take 37.5 mg by mouth daily.      Historical Provider, MD  SUMAtriptan (IMITREX) 50 MG tablet One tablet by mouth at start of migraine.  You may repeat in 2 hours once in 24 hours if headache returns or is not improved 12/28/10   Debbrah Alar, NP  topiramate (TOPAMAX) 25 MG tablet Take 1 tablet (25 mg total) by mouth 2 (two) times daily. 12/28/10   Debbrah Alar, NP    Family History Family History  Problem Relation Age of Onset  . Cancer Mother     uterine  . Diabetes Mother   . Kidney disease Mother     esrd  . Carpal tunnel syndrome Mother   . Hypertension Father   . Hyperlipidemia Maternal Grandfather   . Hypertension Maternal Grandfather   . Diabetes Maternal Grandfather   . Heart attack Maternal Grandfather   . Hyperlipidemia Paternal Grandfather   . Hypertension Paternal Grandfather   . Diabetes Paternal Grandfather   . Heart attack Paternal Grandfather     Social History Social History  Substance Use Topics  . Smoking status: Current Some Day Smoker    Types: Cigarettes  . Smokeless tobacco: Never Used  Comment: 3-5 cigarettes a week.  . Alcohol use No     Allergies   Patient has no known allergies.   Review of Systems Review of Systems ROS reviewed and all are negative for acute change except as noted in the HPI.  Physical Exam Updated Vital Signs BP (!) 149/101   Pulse 82   Temp 98 F (36.7 C) (Oral)   Resp 16   Ht 5\' 6"  (1.676 m)   Wt 104.8 kg   SpO2 100%   BMI 37.28 kg/m   Physical Exam  Constitutional: She is oriented to person, place, and time. Vital signs are normal. She appears well-developed and well-nourished.  HENT:  Head: Normocephalic and atraumatic.  Right Ear: Hearing normal.  Left Ear: Hearing normal.  Eyes: Conjunctivae and EOM are normal. Pupils are equal, round, and reactive to light.  Neck: Normal range of motion.  Neck supple.  Cardiovascular: Normal rate, regular rhythm, normal heart sounds and intact distal pulses.   Pulmonary/Chest: Effort normal and breath sounds normal.  Abdominal: Soft. Bowel sounds are normal. There is no tenderness.  Musculoskeletal: Normal range of motion.  Neurological: She is alert and oriented to person, place, and time.  Skin: Skin is warm and dry.  Psychiatric: She has a normal mood and affect. Her speech is normal and behavior is normal. Thought content normal.  Nursing note and vitals reviewed.  ED Treatments / Results  Labs (all labs ordered are listed, but only abnormal results are displayed) Labs Reviewed  [place], ED    EKG  EKG Interpretation None      Radiology Dg Chest 2 View  Result Date: 12/07/2016 CLINICAL DATA:  Abnormal EKG EXAM: CHEST  2 VIEW COMPARISON:  None. FINDINGS: The heart size and mediastinal contours are within normal limits. Both lungs are clear. The visualized skeletal structures are unremarkable. IMPRESSION: No active cardiopulmonary disease. Electronically Signed   By: Inez Catalina M.D.   On: 12/07/2016 13:01    Procedures Procedures (including critical care time)  Medications Ordered in ED Medications - No data to display   Initial Impression / Assessment and Plan / ED Course  I have reviewed the triage vital signs and the nursing notes.  Pertinent labs & imaging results that were available during my care of the patient were reviewed by me and considered in my medical decision making (see chart for details).  Final Clinical Impressions(s) / ED Diagnoses  {I have reviewed and evaluated the relevant laboratory values. {I have reviewed and evaluated the relevant imaging studies. {I have interpreted the relevant EKG. {I have reviewed the relevant previous healthcare records.  {I obtained HPI from historian. {Patient discussed with supervising physician.  ED Course:  Assessment: Pt is a  31 y.o. female who presents with abnormal ECG from PCP office for routine weight loss follow up. Noted Vtach on office ECG. Converted via EMS without intervention. No CP/SOB/ABD pain. Notes mild chest pressure that is intermittent. Notes hx same in past. Intermittent dizziness with lightheadedness since being started on Phentermine for weight loss. On exam, pt in NAD. Nontoxic/nonseptic appearing. VSS. Afebrile. Lungs CTA. Heart RRR. Abdomen nontender soft. ECG from office notes intermittent Vtach. EKG in ED unremarkable. QtC prolonged at 469. Given 2g Magnesium. CBC unremarkable. BMP unremarkable. Trop negative. CXR unremarkable. Plan is to DC home with outpatient follow up with Cardiology. Notified Cardiology who will call and schedule appointment. At time of discharge, Patient is in no acute  distress. Vital Signs are stable. Patient is able to ambulate. Patient able to tolerate PO.   Disposition/Plan:  DC Home Additional Verbal discharge instructions given and discussed with patient.  Pt Instructed to f/u with Cardiology in the next week for evaluation and treatment of symptoms. Return precautions given Pt acknowledges and agrees with plan  Supervising Physician Deno Etienne, DO  Final diagnoses:  Abnormal EKG    New Prescriptions New Prescriptions   No medications on file     Shary Decamp, PA-C 12/07/16 [place], DO 12/07/16 [phone]

## 2016-12-07 NOTE — Discharge Instructions (Signed)
Please read and follow all provided instructions.  Your diagnoses today include:  1. Abnormal EKG     Tests performed today include: An EKG of your heart A chest x-ray Cardiac enzymes - a blood test for heart muscle damage Blood counts and electrolytes Vital signs. See below for your results today.   Medications prescribed:   Take any prescribed medications only as directed.  Follow-up instructions: Please follow-up with your primary care provider as soon as you can for further evaluation of your symptoms.   Return instructions:  SEEK IMMEDIATE MEDICAL ATTENTION IF: You have severe chest pain, especially if the pain is crushing or pressure-like and spreads to the arms, back, neck, or jaw, or if you have sweating, nausea (feeling sick to your stomach), or shortness of breath. THIS IS AN EMERGENCY. Don't wait to see if the pain will go away. Get medical help at once. Call 911 or 0 (operator). DO NOT drive yourself to the hospital.  Your chest pain gets worse and does not go away with rest.  You have an attack of chest pain lasting longer than usual, despite rest and treatment with the medications your caregiver has prescribed.  You wake from sleep with chest pain or shortness of breath. You feel dizzy or faint. You have chest pain not typical of your usual pain for which you originally saw your caregiver.  You have any other emergent concerns regarding your health.  Additional Information: Chest pain comes from many different causes. Your caregiver has diagnosed you as having chest pain that is not specific for one problem, but does not require admission.  You are at low risk for an acute heart condition or other serious illness.   Your vital signs today were: BP (!) 149/101    Pulse 82    Temp 98 F (36.7 C) (Oral)    Resp 16    Ht 5\' 6"  (1.676 m)    Wt 104.8 kg    SpO2 100%    BMI 37.28 kg/m  If your blood pressure (BP) was elevated above 135/85 this visit, please have this  repeated by your doctor within one month. --------------

## 2016-12-08 NOTE — Progress Notes (Signed)
Cardiology Office Note   Date:  12/10/2016   ID:  [facility] [place], DOB [birth year], MRN [id number]  PCP:  Nance Pear., NP  Cardiologist:   Minus Breeding, MD  Referring:  Nance Pear., NP  Chief Complaint  Patient presents with  . Palpitations      History of Present Illness: Miranda Mccoy is a 31 y.o. female who presents for evaluation of ventricular tachycardia.  She had NSVT.   She was seen in the ED and I evaluated these records for this appt.  She was treated with magnesium.  EKG demonstrated a very mildly prolonged QTc .  She has no past cardiac history. She went to see her primary care doctor talked about back pain. She actually taken a couple days worth of phentermine had in the past.  She was noted to have a rapid heart rate. I don't have that EKG. However, EMS was called because of his wide complex. Do have his rhythm strips. She had a wide complex runs of NSVT with a left bundle branch morphology. In retrospect she says she's always had kind of a nervous feeling in her chest. This comes and goes but she does have a history of anxiety. She's been lightheaded little bit upon standing but has not had any presyncope. She can't bring the symptoms on and they seem to happen at rest but can happen with activity. She does do some exercising. She drinks 2 or 3 cups of coffee daily. She does not have any chest pressure, neck or arm discomfort. She doesn't describe shortness of breath, PND or orthopnea. Her weight fluctuates. She's had no new edema. She does have a stressful job with lots of responsibility.   Past Medical History:  Diagnosis Date  . Allergy   . Anxiety   . Asthma   . GERD (gastroesophageal reflux disease)   . Headache(784.0)   . History of chicken pox   . Skin cancer 2007   Squamous Cell    Past Surgical History:  Procedure Laterality Date  . FRACTURE SURGERY     elbow as child     Current Outpatient Prescriptions  Medication Sig  Dispense Refill  . albuterol (PROAIR HFA) 108 (90 BASE) MCG/ACT inhaler Inhale 2 puffs into the lungs every 6 (six) hours as needed.      . Biotin w/ Vitamins C & E (HAIR SKIN & NAILS GUMMIES PO) Take 2 each by mouth daily.    . budesonide-formoterol (SYMBICORT) 160-4.5 MCG/ACT inhaler Inhale 1 puff into the lungs 2 (two) times daily.      Marland Kitchen ibuprofen (ADVIL,MOTRIN) 200 MG tablet Take 800 mg by mouth every 6 (six) hours as needed for moderate pain.    Marland Kitchen levonorgestrel (MIRENA) 20 MCG/24HR IUD 1 Intra Uterine Device by Intrauterine route once.    . montelukast (SINGULAIR) 10 MG tablet Take 10 mg by mouth daily.      . Multiple Vitamins-Minerals (ADULT ONE DAILY GUMMIES) CHEW Chew 2 each by mouth daily.    Marland Kitchen omeprazole (PRILOSEC) 40 MG capsule Take 40 mg by mouth 2 (two) times daily.    . verapamil (CALAN-SR) 120 MG CR tablet Take 1 tablet (120 mg total) by mouth at bedtime. 30 tablet 0   No current facility-administered medications for this visit.     Allergies:   Other    Social History:  The patient  reports that she has been smoking Cigarettes.  She has never used smokeless tobacco. She reports that  she uses drugs, including Marijuana. She reports that she does not drink alcohol.   Family History:  The patient's family history includes Anxiety disorder in her sister; Cancer in her mother; Carpal tunnel syndrome in her mother; Diabetes in her maternal grandfather, mother, and paternal grandfather; Heart attack in her maternal grandfather and paternal grandfather; Hyperlipidemia in her maternal grandfather and paternal grandfather; Hypertension in her father, maternal grandfather, and paternal grandfather; Kidney disease in her mother; Suicidality in her father.    ROS:  Please see the history of present illness.   Otherwise, review of systems are positive for none.   All other systems are reviewed and negative.    PHYSICAL EXAM: VS:  BP 140/90 (BP Location: Right Arm, Patient Position:  Sitting, Cuff Size: Large)   Pulse 76   Ht 5\' 8"  (1.727 m)   Wt 235 lb 9.6 oz (106.9 kg)   SpO2 98%   BMI 35.82 kg/m  , BMI Body mass index is 35.82 kg/m. GENERAL:  Well appearing HEENT:  Pupils equal round and reactive, fundi not visualized, oral mucosa unremarkable NECK:  No jugular venous distention, waveform within normal limits, carotid upstroke brisk and symmetric, no bruits, no thyromegaly LYMPHATICS:  No cervical, inguinal adenopathy LUNGS:  Clear to auscultation bilaterally BACK:  No CVA tenderness CHEST:  Unremarkable HEART:  PMI not displaced or sustained,S1 and S2 within normal limits, no S3, no S4, no clicks, no rubs, no murmurs ABD:  Flat, positive bowel sounds normal in frequency in pitch, no bruits, no rebound, no guarding, no midline pulsatile mass, no hepatomegaly, no splenomegaly EXT:  2 plus pulses throughout, no edema, no cyanosis no clubbing SKIN:  No rashes no nodules NEURO:  Cranial nerves II through XII grossly intact, motor grossly intact throughout PSYCH:  Cognitively intact, oriented to person place and time    EKG:  EKG is not ordered today. The ekg ordered 12/07/16 demonstrates sinus rhythm, axis within normal limits, intervals within normal limits, nonsustained ventricular tachycardia with a LBBB morphology.    Recent Labs: 12/07/2016: BUN 12; Creatinine, Ser 0.75; Hemoglobin 14.5; Platelets 249; Potassium 4.0; Sodium 139    Lipid Panel    Component Value Date/Time   CHOL 122 02/16/2010 1956   TRIG 61 02/16/2010 1956   HDL 45 02/16/2010 1956   CHOLHDL 2.7 Ratio 02/16/2010 1956   VLDL 12 02/16/2010 1956   LDLCALC 65 02/16/2010 1956      Wt Readings from Last 3 Encounters:  12/09/16 235 lb 9.6 oz (106.9 kg)  12/07/16 231 lb (104.8 kg)  12/28/10 (!) 234 lb 1.9 oz (106.2 kg)      Other studies Reviewed: Additional studies/ records that were reviewed today include: ED records and EMS strips. Review of the above records demonstrates:  Please  see elsewhere in the note.     ASSESSMENT AND PLAN:  NSVT:  LBBB morphology.  I will start with an echo.  She will be started on verapamil.  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  She will also be referred to see Dr. Lovena Le.    ANXIETY:  We discussed the need to have this managed.    OVERWEIGHT:  We discussed specifics of diet and exercise.     Current medicines are reviewed at length with the patient today.  The patient does not have concerns regarding medicines.  The following changes have been made:  As above  Labs/ tests ordered today include:   Orders Placed This  Encounter  Procedures  . ECHOCARDIOGRAM COMPLETE    Disposition:   FU with Dr. Lovena Le.      Signed, Minus Breeding, MD  12/10/2016 1:33 PM    [facility]

## 2016-12-09 ENCOUNTER — Ambulatory Visit (INDEPENDENT_AMBULATORY_CARE_PROVIDER_SITE_OTHER): Payer: 59 | Admitting: Cardiology

## 2016-12-09 ENCOUNTER — Encounter: Payer: Self-pay | Admitting: Cardiology

## 2016-12-09 VITALS — BP 140/90 | HR 76 | Ht 68.0 in | Wt 235.6 lb

## 2016-12-09 DIAGNOSIS — I472 Ventricular tachycardia, unspecified: Secondary | ICD-10-CM

## 2016-12-09 MED ORDER — VERAPAMIL HCL ER 120 MG PO TBCR: 120.0000 mg | EXTENDED_RELEASE_TABLET | Freq: Every day | ORAL | 0 refills | Status: DC

## 2016-12-09 MED ORDER — VERAPAMIL HCL ER 120 MG PO TBCR: 120.0000 mg | EXTENDED_RELEASE_TABLET | Freq: Every day | ORAL | 3 refills | Status: DC

## 2016-12-09 NOTE — Patient Instructions (Addendum)
Medication Instructions:  START- Verapamil 120 mg 1 tablets daily  Labwork: None Ordered  Testing/Procedures: Your physician has requested that you have an echocardiogram. Echocardiography is a painless test that uses sound waves to create images of your heart. It provides your doctor with information about the size and shape of your heart and how well your heart's chambers and valves are working. This procedure takes approximately one hour. There are no restrictions for this procedure.  Your physician has requested that you have an exercise tolerance test. For further information please visit [email or web address]. Please also follow instruction sheet, as given.  Follow-Up: Your physician recommends that you schedule a follow-up appointment in: Same day as GXT   Any Other Special Instructions Will Be Listed Below (If Applicable).   If you need a refill on your cardiac medications before your next appointment, please call your pharmacy.

## 2016-12-10 ENCOUNTER — Encounter: Payer: Self-pay | Admitting: Cardiology

## 2016-12-13 ENCOUNTER — Telehealth: Payer: Self-pay | Admitting: Cardiology

## 2016-12-13 NOTE — Telephone Encounter (Signed)
New message  Patient calling stating Dr. Percival Spanish wanted her to see another MD within the practice as a urgent request. Patient states she has not heard from anyone on date/time / MD

## 2016-12-13 NOTE — Telephone Encounter (Signed)
Reviewed last office note regarding referral and see that patient is supposed to see Dr Lovena Le No appointment scheduled at this time  Did send a message Melissa who does scheduling for Dr Lovena Le Left message to call back

## 2016-12-14 NOTE — Telephone Encounter (Signed)
Pt have an appt to see Dr Curt Bears March 29th @ 10:00 am, pt made aware of her appt and voice understanding.

## 2016-12-14 NOTE — Telephone Encounter (Signed)
-----   Message from Minus Breeding, MD sent at 12/10/2016  1:32 PM EST ----- Schedule the follow up with Dr. Lovena Le.  Cancel any follow up with me and cancel the POET ([employer] Exercise Treadmill)

## 2016-12-19 ENCOUNTER — Telehealth: Payer: Self-pay | Admitting: Cardiology

## 2016-12-19 NOTE — Telephone Encounter (Signed)
New message    pt verbalized that she is calling to speak to rn    She needs a code for the EKG to be done by pt   Please call

## 2016-12-20 ENCOUNTER — Encounter: Payer: Self-pay | Admitting: Cardiology

## 2016-12-23 ENCOUNTER — Other Ambulatory Visit (HOSPITAL_COMMUNITY): Payer: 59

## 2016-12-27 ENCOUNTER — Ambulatory Visit: Payer: 59 | Admitting: Cardiology

## 2016-12-27 ENCOUNTER — Encounter (HOSPITAL_COMMUNITY): Payer: 59

## 2016-12-29 ENCOUNTER — Institutional Professional Consult (permissible substitution): Payer: 59 | Admitting: Cardiology

## 2016-12-29 NOTE — Progress Notes (Deleted)
Electrophysiology Office Note   Date:  12/29/2016   ID:  [facility] Waheed, DOB 1986-01-03, MRN [id number]  PCP:  Nance Pear., NP  Cardiologist:  Percival Spanish Primary Electrophysiologist:  Constance Haw, MD    No chief complaint on file.    History of Present Illness: Miranda Mccoy is a 31 y.o. female who presents today for electrophysiology evaluation.   She presented to her primary doctor on 3/7 and was found to have an abnormal EKG. She had 5-6 runs of ventricular tachycardia in the office. She spontaneously converted in route to the emergency room via EMS. She had mild chest pressure during the episodes. EKG showed a mildly prolonged QTc. 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   Today, she denies*** symptoms of palpitations, chest pain, shortness of breath, orthopnea, PND, lower extremity edema, claudication, dizziness, presyncope, syncope, bleeding, or neurologic sequela. The patient is tolerating medications without difficulties and is otherwise without complaint today.    Past Medical History:  Diagnosis Date  . Allergy   . Anxiety   . Asthma   . GERD (gastroesophageal reflux disease)   . Headache(784.0)   . History of chicken pox   . Skin cancer 2007   Squamous Cell   Past Surgical History:  Procedure Laterality Date  . FRACTURE SURGERY     elbow as child     Current Outpatient Prescriptions  Medication Sig Dispense Refill  . albuterol (PROAIR HFA) 108 (90 BASE) MCG/ACT inhaler Inhale 2 puffs into the lungs every 6 (six) hours as needed.      . Biotin w/ Vitamins C & E (HAIR SKIN & NAILS GUMMIES PO) Take 2 each by mouth daily.    . budesonide-formoterol (SYMBICORT) 160-4.5 MCG/ACT inhaler Inhale 1 puff into the lungs 2 (two) times daily.      Marland Kitchen ibuprofen (ADVIL,MOTRIN) 200 MG tablet Take 800 mg by mouth every 6 (six) hours as needed for moderate pain.    Marland Kitchen levonorgestrel (MIRENA)  20 MCG/24HR IUD 1 Intra Uterine Device by Intrauterine route once.    . montelukast (SINGULAIR) 10 MG tablet Take 10 mg by mouth daily.      . Multiple Vitamins-Minerals (ADULT ONE DAILY GUMMIES) CHEW Chew 2 each by mouth daily.    Marland Kitchen omeprazole (PRILOSEC) 40 MG capsule Take 40 mg by mouth 2 (two) times daily.    . verapamil (CALAN-SR) 120 MG CR tablet Take 1 tablet (120 mg total) by mouth at bedtime. 30 tablet 0   No current facility-administered medications for this visit.     Allergies:   Other   Social History:  The patient  reports that she has been smoking Cigarettes.  She has never used smokeless tobacco. She reports that she uses drugs, including Marijuana. She reports that she does not drink alcohol.   Family History:  The patient's family history includes Anxiety disorder in her sister; Cancer in her mother; Carpal tunnel syndrome in her mother; Diabetes in her maternal grandfather, mother, and paternal grandfather; Heart attack in her maternal grandfather and paternal grandfather; Hyperlipidemia in her maternal grandfather and paternal grandfather; Hypertension in her father, maternal grandfather, and paternal grandfather; Kidney disease in her mother; Suicidality in her father.    ROS:  Please see the history of present illness.   Otherwise, review of systems is positive for ***.   All other systems are reviewed and negative.    PHYSICAL  EXAM: VS:  There were no vitals taken for this visit. , BMI There is no height or weight on file to calculate BMI. GEN: Well nourished, well developed, in no acute distress  HEENT: normal  Neck: no JVD, carotid bruits, or masses Cardiac: ***RRR; no murmurs, rubs, or gallops,no edema  Respiratory:  clear to auscultation bilaterally, normal work of breathing GI: soft, nontender, nondistended, + BS MS: no deformity or atrophy  Skin: warm and dry Neuro:  Strength and sensation are intact Psych: euthymic mood, full affect  EKG:  EKG is ordered  today. Personal review of the ekg ordered 12/08/16 shows sinus rhythm, QTc 469  Recent Labs: 12/07/2016: BUN 12; Creatinine, Ser 0.75; Hemoglobin 14.5; Platelets 249; Potassium 4.0; Sodium 139    Lipid Panel     Component Value Date/Time   CHOL 122 02/16/2010 1956   TRIG 61 02/16/2010 1956   HDL 45 02/16/2010 1956   CHOLHDL 2.7 Ratio 02/16/2010 1956   VLDL 12 02/16/2010 1956   LDLCALC 65 02/16/2010 1956     Wt Readings from Last 3 Encounters:  12/09/16 235 lb 9.6 oz (106.9 kg)  12/07/16 231 lb (104.8 kg)  12/28/10 (!) 234 lb 1.9 oz (106.2 kg)      Other studies Reviewed: Additional studies/ records that were reviewed today include: Epic notes, TTE pending   ASSESSMENT AND PLAN:  1.  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 These EKGs are in Epic. She was started on verapamil. An echocardiogram is currently pending.    Current medicines are reviewed at length with the patient today.   The patient {ACTIONS; HAS/DOES NOT HAVE:19233} concerns regarding her medicines.  The following changes were made today:  {NONE DEFAULTED:18576::"none"}  Labs/ tests ordered today include: *** No orders of the defined types were placed in this encounter.    Disposition:   FU with *** {gen number [id number]:032122} {Days to years:10300}  Signed, Tihanna Goodson [name], MD  12/29/2016 8:22 AM     [facility] [address] [place] [address] [phone] (office) [phone] (fax)

## 2017-01-02 ENCOUNTER — Encounter: Payer: Self-pay | Admitting: Cardiology

## 2017-01-02 ENCOUNTER — Ambulatory Visit (INDEPENDENT_AMBULATORY_CARE_PROVIDER_SITE_OTHER): Payer: 59 | Admitting: Cardiology

## 2017-01-02 VITALS — BP 120/100 | HR 84 | Ht 68.0 in | Wt 242.6 lb

## 2017-01-02 DIAGNOSIS — I472 Ventricular tachycardia, unspecified: Secondary | ICD-10-CM | POA: Insufficient documentation

## 2017-01-02 MED ORDER — VERAPAMIL HCL ER 240 MG PO TBCR: 240.0000 mg | EXTENDED_RELEASE_TABLET | Freq: Every day | ORAL | 3 refills | Status: DC

## 2017-01-02 MED ORDER — VERAPAMIL HCL ER 240 MG PO TBCR: 240.0000 mg | EXTENDED_RELEASE_TABLET | Freq: Every day | ORAL | 2 refills | Status: DC

## 2017-01-02 NOTE — Progress Notes (Signed)
Electrophysiology Office Note   Date:  01/02/2017   ID:  Miranda Mccoy, DOB 1986/08/25, MRN [id number]  PCP:  Annetta Maw, MD  Cardiologist:  Hochrein Primary Electrophysiologist:  Constance Haw, MD    Chief Complaint  Patient presents with  . Palpitations     History of Present Illness: Miranda Mccoy is a 31 y.o. female who presents today for electrophysiology evaluation.   Miranda Mccoy is a 31 y.o. female who is being seen today for the evaluation of VT at the request of Debbrah Alar, NP. She initially presented to the emergency room with a wide complex tachycardia. She was treated with magnesium. Her EKG demonstrated a mildly prolonged QTc. She went to see her primary doctor to discuss back pain issues. She taken a couple doses of phentermine in the past. She was noted to have a rapid heart rate and EMS found her to be in a wide complex tachycardia. She says that she is always had a nervous feeling in her chest. There is no exacerbating or alleviating factors from her symptoms. She drinks 2-3 cups of coffee per day. She does not have chest pressure or arm discomfort.    Today, she denies symptoms of chest pain, shortness of breath, orthopnea, PND, lower extremity edema, claudication, dizziness, presyncope, syncope, bleeding, or neurologic sequela. The patient is tolerating medications without difficulties and is otherwise without complaint today.  Her palpitations have been improved since starting verapamil. They have not totally gone away. She feels that her anxiety might play a part in exacerbating her symptoms. She also drinks quite a bit of caffeine and she feels that this may also play a part.   Past Medical History:  Diagnosis Date  . Allergy   . Anxiety   . Asthma   . GERD (gastroesophageal reflux disease)   . Headache(784.0)   . History of chicken pox   . Skin cancer 2007   Squamous Cell   Past Surgical History:  Procedure Laterality Date  .  FRACTURE SURGERY     elbow as child     Current Outpatient Prescriptions  Medication Sig Dispense Refill  . albuterol (PROAIR HFA) 108 (90 BASE) MCG/ACT inhaler Inhale 2 puffs into the lungs every 6 (six) hours as needed.      . Biotin w/ Vitamins C & E (HAIR SKIN & NAILS GUMMIES PO) Take 2 each by mouth daily.    . budesonide-formoterol (SYMBICORT) 160-4.5 MCG/ACT inhaler Inhale 1 puff into the lungs 2 (two) times daily.      Marland Kitchen ibuprofen (ADVIL,MOTRIN) 200 MG tablet Take 800 mg by mouth every 6 (six) hours as needed for moderate pain.    Marland Kitchen levonorgestrel (MIRENA) 20 MCG/24HR IUD 1 Intra Uterine Device by Intrauterine route once.    . montelukast (SINGULAIR) 10 MG tablet Take 10 mg by mouth daily.      . Multiple Vitamins-Minerals (ADULT ONE DAILY GUMMIES) CHEW Chew 2 each by mouth daily.    Marland Kitchen omeprazole (PRILOSEC) 40 MG capsule Take 40 mg by mouth 2 (two) times daily.    . verapamil (CALAN-SR) 240 MG CR tablet Take 1 tablet (240 mg total) by mouth at bedtime. 90 tablet 3   No current facility-administered medications for this visit.     Allergies:   Other   Social History:  The patient  reports that she has been smoking Cigarettes.  She has never used smokeless tobacco. She reports that she uses drugs, including Marijuana. She reports  that she does not drink alcohol.   Family History:  The patient's family history includes Anxiety disorder in her sister; Cancer in her mother; Carpal tunnel syndrome in her mother; Diabetes in her maternal grandfather, mother, and paternal grandfather; Heart attack in her maternal grandfather and paternal grandfather; Hyperlipidemia in her maternal grandfather and paternal grandfather; Hypertension in her father, maternal grandfather, and paternal grandfather; Kidney disease in her mother; Suicidality in her father.    ROS:  Please see the history of present illness.   Otherwise, review of systems is positive for palpitations.   All other systems are  reviewed and negative.    PHYSICAL EXAM: VS:  BP (!) 120/100 (BP Location: Right Arm, Patient Position: Sitting, Cuff Size: Large)   Pulse 84   Ht 5\' 8"  (1.727 m)   Wt 242 lb 9.6 oz (110 kg)   BMI 36.89 kg/m  , BMI Body mass index is 36.89 kg/m. GEN: Well nourished, well developed, in no acute distress  HEENT: normal  Neck: no JVD, carotid bruits, or masses Cardiac: RRR; no murmurs, rubs, or gallops,no edema  Respiratory:  clear to auscultation bilaterally, normal work of breathing GI: soft, nontender, nondistended, + BS MS: no deformity or atrophy  Skin: warm and dry Neuro:  Strength and sensation are intact Psych: euthymic mood, full affect  EKG:  EKG is not ordered today. Personal review of the ekg ordered 12/07/16 shows sinus rhythm, rate 83  Recent Labs: 12/07/2016: BUN 12; Creatinine, Ser 0.75; Hemoglobin 14.5; Platelets 249; Potassium 4.0; Sodium 139    Lipid Panel     Component Value Date/Time   CHOL 122 02/16/2010 1956   TRIG 61 02/16/2010 1956   HDL 45 02/16/2010 1956   CHOLHDL 2.7 Ratio 02/16/2010 1956   VLDL 12 02/16/2010 1956   LDLCALC 65 02/16/2010 1956     Wt Readings from Last 3 Encounters:  01/02/17 242 lb 9.6 oz (110 kg)  12/09/16 235 lb 9.6 oz (106.9 kg)  12/07/16 231 lb (104.8 kg)      Other studies Reviewed: Additional studies/ records that were reviewed today include: Epic notes   ASSESSMENT AND PLAN:  1.  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 She has had improved symptoms since starting verapamil. As she has had to need symptoms on her verapamil, will plan to increase to 240 mg a day. She is planned have an echo in 2 days. We'll see her back in 6 weeks to determine if the increased dose of verapamil is helping with her symptoms. We did discuss possible ablation of her likely RVOT tachycardia. She would like to try medical  management at this time. She may benefit from a 48 hour monitor to determine her arrhythmia burden at the next visit. She does have an alive core monitor as well that will help determine if her symptoms are related to tachycardia   Current medicines are reviewed at length with the patient today.   The patient does not have concerns regarding her medicines.  The following changes were made today:  Increase verapamil to 240 mg  Labs/ tests ordered today include:  No orders of the defined types were placed in this encounter.    Disposition:   FU with Will Camnitz 6 weeks  Signed, Will [name], MD  01/02/2017 3:45 PM     [facility] [address] [place] [address] (  [phone] (office) [phone] (fax)

## 2017-01-02 NOTE — Telephone Encounter (Signed)
Pt seen today in clinic.  She does not need code for EKG anymore.  Closing encounter.

## 2017-01-02 NOTE — Patient Instructions (Signed)
Medication Instructions:    Your physician has recommended you make the following change in your medication:  1) INCREASE Verapamil to 240 mg daily  --- If you need a refill on your cardiac medications before your next appointment, please call your pharmacy. ---  Labwork:  None ordered  Testing/Procedures:  None ordered  Follow-Up:  Your physician recommends that you schedule a follow-up appointment in: 6 weeks with Dr. Curt Bears.  Thank you for choosing [facility]!!   Trinidad Curet, RN [phone]

## 2017-01-04 ENCOUNTER — Other Ambulatory Visit: Payer: Self-pay

## 2017-01-04 ENCOUNTER — Ambulatory Visit (HOSPITAL_COMMUNITY): Payer: 59 | Attending: Internal Medicine

## 2017-01-04 DIAGNOSIS — I501 Left ventricular failure: Secondary | ICD-10-CM | POA: Insufficient documentation

## 2017-01-04 DIAGNOSIS — I472 Ventricular tachycardia, unspecified: Secondary | ICD-10-CM

## 2017-01-04 DIAGNOSIS — I340 Nonrheumatic mitral (valve) insufficiency: Secondary | ICD-10-CM | POA: Diagnosis not present

## 2017-01-04 LAB — ECHOCARDIOGRAM COMPLETE
AOASC: 30 cm
AVLVOTPG: 4 mmHg
CHL CUP LV S' LATERAL: 11.5 cm/s
EERAT: 10.6
EWDT: 264 ms
FS: 36 % (ref 28–44)
IVS/LV PW RATIO, ED: 1.04
LA ID, A-P, ES: 36 mm
LA diam end sys: 36 mm
LA vol A4C: 52 ml
LA vol index: 25.2 mL/m2
LA vol: 56 mL
LADIAMINDEX: 1.62 cm/m2
LDCA: 3.14 cm2
LV E/e' medial: 10.6
LV PW d: 11.5 mm — AB (ref 0.6–1.1)
LV SIMPSON'S DISK: 61
LV TDI E'LATERAL: 9.36
LV dias vol index: 40 mL/m2
LV dias vol: 89 mL (ref 46–106)
LV e' LATERAL: 9.36 cm/s
LVEEAVG: 10.6
LVOT SV: 62 mL
LVOT VTI: 19.9 cm
LVOT diameter: 20 mm
LVOT peak vel: 103 cm/s
LVSYSVOL: 35 mL (ref 14–42)
LVSYSVOLIN: 16 mL/m2
MV Dec: 264
MV Peak grad: 4 mmHg
MV pk E vel: 99.2 m/s
MVPKAVEL: 60.2 m/s
Stroke v: 54 ml
TDI e' medial: 6.34

## 2017-02-15 ENCOUNTER — Ambulatory Visit: Payer: 59 | Admitting: Cardiology

## 2017-03-13 ENCOUNTER — Encounter (INDEPENDENT_AMBULATORY_CARE_PROVIDER_SITE_OTHER): Payer: Self-pay

## 2017-03-13 ENCOUNTER — Ambulatory Visit (INDEPENDENT_AMBULATORY_CARE_PROVIDER_SITE_OTHER): Payer: 59 | Admitting: Cardiology

## 2017-03-13 ENCOUNTER — Encounter: Payer: Self-pay | Admitting: Cardiology

## 2017-03-13 VITALS — BP 144/82 | HR 86 | Ht 66.0 in | Wt 241.2 lb

## 2017-03-13 DIAGNOSIS — I472 Ventricular tachycardia, unspecified: Secondary | ICD-10-CM

## 2017-03-13 MED ORDER — VERAPAMIL HCL ER 120 MG PO TBCR: 120.0000 mg | EXTENDED_RELEASE_TABLET | Freq: Every day | ORAL | 0 refills | Status: DC

## 2017-03-13 MED ORDER — FLECAINIDE ACETATE 50 MG PO TABS: 50.0000 mg | ORAL_TABLET | Freq: Two times a day (BID) | ORAL | 1 refills | Status: DC

## 2017-03-13 MED ORDER — VERAPAMIL HCL ER 120 MG PO TBCR: 120.0000 mg | EXTENDED_RELEASE_TABLET | Freq: Every day | ORAL | 1 refills | Status: DC

## 2017-03-13 NOTE — Patient Instructions (Addendum)
Medication Instructions:   Your physician has recommended you make the following change in your medication:  1) DECREASE Verapamil to 120 mg daily 2) START Flecainide 100 mg twice a day -- START THIS MEDICATION 7-10 DAYS PRIOR TO STRESS TEST  - If you need a refill on your cardiac medications before your next appointment, please call your pharmacy.   Labwork:  None ordered  Testing/Procedures: Your physician has requested that you have an exercise tolerance test - you will start Flecainide 7-10 days prior to this testing. For further information please visit [email or web address]. Please also follow instruction sheet, as given.  Follow-Up:  Your physician recommends that you schedule a follow-up appointment in: 3 months with Dr. Curt Bears.  Thank you for choosing [facility]!!   Trinidad Curet, RN [phone]  Any Other Special Instructions Will Be Listed Below (If Applicable).   Exercise Stress Electrocardiogram An exercise stress electrocardiogram is a test that is done to evaluate the blood supply to your heart. This test may also be called exercise stress electrocardiography. The test is done while you are walking on a treadmill. The goal of this test is to raise your heart rate. This test is done to find areas of poor blood flow to the heart by determining the extent of coronary artery disease (CAD). CAD is defined as narrowing in one or more heart (coronary) arteries of more than 70%. If you have an abnormal test result, this may mean that you are not getting adequate blood flow to your heart during exercise. Additional testing may be needed to understand why your test was abnormal. Tell a health care provider about:  Any allergies you have.  All medicines you are taking, including vitamins, herbs, eye drops, creams, and over-the-counter medicines.  Any problems you or family members have had with anesthetic medicines.  Any blood disorders you have.  Any surgeries you  have had.  Any medical conditions you have.  Possibility of pregnancy, if this applies. What are the risks? Generally, this is a safe procedure. However, as with any procedure, complications can occur. Possible complications can include:  Pain or pressure in the following areas: ? Chest. ? Jaw or neck. ? Between your shoulder blades. ? Radiating down your left arm.  Dizziness or light-headedness.  Shortness of breath.  Increased or irregular heartbeats.  Nausea or vomiting.  Heart attack (rare).  What happens before the procedure?  Avoid all forms of caffeine 24 hours before your test or as directed by your health care provider. This includes coffee, tea (even decaffeinated tea), caffeinated sodas, chocolate, cocoa, and certain pain medicines.  Follow your health care provider's instructions regarding eating and drinking before the test.  Take your medicines as directed at regular times with water unless instructed otherwise. Exceptions may include: ? If you have diabetes, ask how you are to take your insulin or pills. It is common to adjust insulin dosing the morning of the test. ? If you are taking beta-blocker medicines, it is important to talk to your health care provider about these medicines well before the date of your test. Taking beta-blocker medicines may interfere with the test. In some cases, these medicines need to be changed or stopped 24 hours or more before the test. ? If you wear a nitroglycerin patch, it may need to be removed prior to the test. Ask your health care provider if the patch should be removed before the test.  If you use an inhaler for any breathing condition, bring  it with you to the test.  If you are an outpatient, bring a snack so you can eat right after the stress phase of the test.  Do not smoke for 4 hours prior to the test or as directed by your health care provider.  Do not apply lotions, powders, creams, or oils on your chest prior to  the test.  Wear loose-fitting clothes and comfortable shoes for the test. This test involves walking on a treadmill. What happens during the procedure?  Multiple patches (electrodes) will be put on your chest. If needed, small areas of your chest may have to be shaved to get better contact with the electrodes. Once the electrodes are attached to your body, multiple wires will be attached to the electrodes and your heart rate will be monitored.  Your heart will be monitored both at rest and while exercising.  You will walk on a treadmill. The treadmill will be started at a slow pace. The treadmill speed and incline will gradually be increased to raise your heart rate. What happens after the procedure?  Your heart rate and blood pressure will be monitored after the test.  You may return to your normal schedule including diet, activities, and medicines, unless your health care provider tells you otherwise. This information is not intended to replace advice given to you by your health care provider. Make sure you discuss any questions you have with your health care provider. Document Released: 09/16/2000 Document Revised: 02/25/2016 Document Reviewed: 05/27/2013 Elsevier Interactive Patient Education  2017 Elsevier Inc.   Flecainide tablets What is this medicine? FLECAINIDE (FLEK a nide) is an antiarrhythmic drug. This medicine is used to prevent irregular heart rhythm. It can also slow down fast heartbeats called tachycardia. This medicine may be used for other purposes; ask your health care provider or pharmacist if you have questions. COMMON BRAND NAME(S): Tambocor What should I tell my health care provider before I take this medicine? They need to know if you have any of these conditions: -abnormal levels of potassium in the blood -heart disease including heart rhythm and heart rate problems -kidney or liver disease -recent heart attack -an unusual or allergic reaction to flecainide,  local anesthetics, other medicines, foods, dyes, or preservatives -pregnant or trying to get pregnant -breast-feeding How should I use this medicine? Take this medicine by mouth with a glass of water. Follow the directions on the prescription label. You can take this medicine with or without food. Take your doses at regular intervals. Do not take your medicine more often than directed. Do not stop taking this medicine suddenly. This may cause serious, heart-related side effects. If your doctor wants you to stop the medicine, the dose may be slowly lowered over time to avoid any side effects. Talk to your pediatrician regarding the use of this medicine in children. While this drug may be prescribed for children as young as 1 year of age for selected conditions, precautions do apply. Overdosage: If you think you have taken too much of this medicine contact a poison control center or emergency room at once. NOTE: This medicine is only for you. Do not share this medicine with others. What if I miss a dose? If you miss a dose, take it as soon as you can. If it is almost time for your next dose, take only that dose. Do not take double or extra doses. What may interact with this medicine? Do not take this medicine with any of the following medications: -amoxapine -arsenic  trioxide -certain antibiotics like clarithromycin, erythromycin, gatifloxacin, gemifloxacin, levofloxacin, moxifloxacin, sparfloxacin, or troleandomycin -certain antidepressants called tricyclic antidepressants like amitriptyline, imipramine, or nortriptyline -certain medicines to control heart rhythm like disopyramide, dofetilide, encainide, moricizine, procainamide, propafenone, and quinidine -cisapride -cyclobenzaprine -delavirdine -droperidol -haloperidol -hawthorn -imatinib -levomethadyl -maprotiline -medicines for malaria like chloroquine and halofantrine -pentamidine -phenothiazines like chlorpromazine, mesoridazine,  prochlorperazine, thioridazine -pimozide -quinine -ranolazine -ritonavir -sertindole -ziprasidone This medicine may also interact with the following medications: -cimetidine -medicines for angina or high blood pressure -medicines to control heart rhythm like amiodarone and digoxin This list may not describe all possible interactions. Give your health care provider a list of all the medicines, herbs, non-prescription drugs, or dietary supplements you use. Also tell them if you smoke, drink alcohol, or use illegal drugs. Some items may interact with your medicine. What should I watch for while using this medicine? Visit your doctor or health care professional for regular checks on your progress. Because your condition and the use of this medicine carries some risk, it is a good idea to carry an identification card, necklace or bracelet with details of your condition, medications and doctor or health care professional. Check your blood pressure and pulse rate regularly. Ask your health care professional what your blood pressure and pulse rate should be, and when you should contact him or her. Your doctor or health care professional also may schedule regular blood tests and electrocardiograms to check your progress. You may get drowsy or dizzy. Do not drive, use machinery, or do anything that needs mental alertness until you know how this medicine affects you. Do not stand or sit up quickly, especially if you are an older patient. This reduces the risk of dizzy or fainting spells. Alcohol can make you more dizzy, increase flushing and rapid heartbeats. Avoid alcoholic drinks. What side effects may I notice from receiving this medicine? Side effects that you should report to your doctor or health care professional as soon as possible: -chest pain, continued irregular heartbeats -difficulty breathing -swelling of the legs or feet -trembling, shaking -unusually weak or tired Side effects that usually  do not require medical attention (report to your doctor or health care professional if they continue or are bothersome): -blurred vision -constipation -headache -nausea, vomiting -stomach pain This list may not describe all possible side effects. Call your doctor for medical advice about side effects. You may report side effects to FDA at 1-800-FDA-1088. Where should I keep my medicine? Keep out of the reach of children. Store at room temperature between 15 and 30 degrees C (59 and 86 degrees F). Protect from light. Keep container tightly closed. Throw away any unused medicine after the expiration date. NOTE: This sheet is a summary. It may not cover all possible information. If you have questions about this medicine, talk to your doctor, pharmacist, or health care provider.  2018 Elsevier/Gold Standard (2008-01-23 16:46:09)

## 2017-03-13 NOTE — Progress Notes (Signed)
Electrophysiology Office Note   Date:  03/13/2017   ID:  [name], DOB 04-13-1986, MRN [id number]  PCP:  Annetta Maw, MD  Cardiologist:  [place] Primary Electrophysiologist:  Zionna Homewood [name], MD    Chief Complaint  Patient presents with  . Follow-up    Ventricular tachycardia     History of Present Illness: Miranda Mccoy is a 30 y.o. female who presents today for electrophysiology evaluation.   Miranda Mccoy is a 31 y.o. female who is being seen today for the evaluation of VT at the request of Annetta Maw, MD. She initially presented to the emergency room with a wide complex tachycardia. She was treated with magnesium. Her EKG demonstrated a mildly prolonged QTc. She went to see her primary doctor to discuss back pain issues. She taken a couple doses of phentermine in the past. She was noted to have a rapid heart rate and EMS found her to be in a wide complex tachycardia. He verapamil dose was increased at the last visit.   Today, denies symptoms of chest pain, shortness of breath, orthopnea, PND, lower extremity edema, claudication, dizziness, presyncope, syncope, bleeding, or neurologic sequela. The patient is tolerating medications without difficulties and is otherwise without complaint today. She is continuing to have episodes of palpitations. The patient her at all times daily. There are no exacerbating factors. She does say that he verapamil has helped with her symptoms. She also says her very fatigued. He has been drinking espresso in the mornings    Past Medical History:  Diagnosis Date  . Allergy   . Anxiety   . Asthma   . GERD (gastroesophageal reflux disease)   . Headache(784.0)   . History of chicken pox   . Skin cancer 2007   Squamous Cell   Past Surgical History:  Procedure Laterality Date  . FRACTURE SURGERY     elbow as child     Current Outpatient Prescriptions  Medication Sig Dispense Refill  . albuterol (PROAIR HFA) 108 (90  BASE) MCG/ACT inhaler Inhale 2 puffs into the lungs every 6 (six) hours as needed.      . Biotin w/ Vitamins C & E (HAIR SKIN & NAILS GUMMIES PO) Take 2 each by mouth daily.    . budesonide-formoterol (SYMBICORT) 160-4.5 MCG/ACT inhaler Inhale 1 puff into the lungs 2 (two) times daily.      Marland Kitchen ibuprofen (ADVIL,MOTRIN) 200 MG tablet Take 800 mg by mouth every 6 (six) hours as needed for moderate pain.    Marland Kitchen levonorgestrel (MIRENA) 20 MCG/24HR IUD 1 Intra Uterine Device by Intrauterine route once.    . montelukast (SINGULAIR) 10 MG tablet Take 10 mg by mouth daily.      . Multiple Vitamins-Minerals (ADULT ONE DAILY GUMMIES) CHEW Chew 2 each by mouth daily.    Marland Kitchen omeprazole (PRILOSEC) 40 MG capsule Take 40 mg by mouth 2 (two) times daily.    . verapamil (CALAN-SR) 240 MG CR tablet Take 1 tablet (240 mg total) by mouth at bedtime. 90 tablet 3   No current facility-administered medications for this visit.     Allergies:   Other   Social History:  The patient  reports that she has been smoking Cigarettes.  She has never used smokeless tobacco. She reports that she uses drugs, including Marijuana. She reports that she does not drink alcohol.   Family History:  The patient's family history includes Anxiety disorder in her sister; Cancer in her mother; Carpal tunnel syndrome  in her mother; Diabetes in her maternal grandfather, mother, and paternal grandfather; Heart attack in her maternal grandfather and paternal grandfather; Hyperlipidemia in her maternal grandfather and paternal grandfather; Hypertension in her father, maternal grandfather, and paternal grandfather; Kidney disease in her mother; Suicidality in her father.    ROS:  Please see the history of present illness.   Otherwise, review of systems is positive for Hypertension, anxiety.   All other systems are reviewed and negative.     PHYSICAL EXAM: VS:  BP (!) 144/82   Pulse 86   Ht 5\' 6"  (1.676 m)   Wt 241 lb 3.2 oz (109.4 kg)   BMI 38.93  kg/m  , BMI Body mass index is 38.93 kg/m. GEN: Well nourished, well developed, in no acute distress  HEENT: normal  Neck: no JVD, carotid bruits, or masses Cardiac: RRR; no murmurs, rubs, or gallops,no edema  Respiratory:  clear to auscultation bilaterally, normal work of breathing GI: soft, nontender, nondistended, + BS MS: no deformity or atrophy  Skin: warm and dry Neuro:  Strength and sensation are intact Psych: euthymic mood, full affect  EKG:  EKG is not ordered today. Personal review of the ekg ordered 12/08/16 shows sinus rhythm  Recent Labs: 12/07/2016: BUN 12; Creatinine, Ser 0.75; Hemoglobin 14.5; Platelets 249; Potassium 4.0; Sodium 139    Lipid Panel     Component Value Date/Time   CHOL 122 02/16/2010 1956   TRIG 61 02/16/2010 1956   HDL 45 02/16/2010 1956   CHOLHDL 2.7 Ratio 02/16/2010 1956   VLDL 12 02/16/2010 1956   LDLCALC 65 02/16/2010 1956     Wt Readings from Last 3 Encounters:  03/13/17 241 lb 3.2 oz (109.4 kg)  01/02/17 242 lb 9.6 oz (110 kg)  12/09/16 235 lb 9.6 oz (106.9 kg)      Other studies Reviewed: Additional studies/ records that were reviewed today include: TTE 01/04/17 - Left ventricle: The cavity size was normal. Wall thickness was   increased in a pattern of mild LVH. Systolic function was normal.   The estimated ejection fraction was in the range of 60% to 65%.   Wall motion was normal; there were no regional wall motion   abnormalities. Doppler parameters are consistent with abnormal   left ventricular relaxation (grade 1 diastolic dysfunction). The   E/e&' ratio is between 8-15, suggesting indeterminate LV filling   pressure. - Mitral valve: Mildly thickened leaflets . There was trivial   regurgitation. - Left atrium: The atrium was normal in size. - Inferior vena cava: The vessel was normal in size. The   respirophasic diameter changes were in the normal range (>= 50%),   consistent with normal central venous  pressure.   ASSESSMENT AND PLAN:  1.  Ventricular tachycardia: Has been put on verapamil was increased to 240 last visit. She is complaining of significant amounts of fatigue since her dose was increased. Due to her fatigue, plan to decrease her verapamil to 120 mg. We Miranda Mccoy start her on 100 mg flecainide. Plan for stress testing in 7-10 days.   Current medicines are reviewed at length with the patient today.   The patient does not have concerns regarding her medicines.  The following changes were made today:  Decreased verapamil, start flecainide  Labs/ tests ordered today include:  No orders of the defined types were placed in this encounter.    Disposition:   FU with Taos Tapp 3 months  Signed, Naquisha Whitehair [name], MD  03/13/2017 1:54  PM     [address] [place] [address] [phone] (office) [phone] (fax)

## 2017-04-19 ENCOUNTER — Telehealth: Payer: Self-pay | Admitting: *Deleted

## 2017-04-19 NOTE — Telephone Encounter (Addendum)
Late Entry: 04/11/2017 11:45am Called pt to discuss GXT scheduled for 7/11.  She tells me that she is out of town and will not be able to make stress test appt the next day (this is the second time she has cancelled this testing).  She reports starting Flecainide.  Reviewed with patient the importance/reasoning for GXT after Flecainide start. Advised patient that she needed to re-schedule GXT asap. Pt states she will call to reschedule when she is back in town.  (Dr. Curt Bears made aware)

## 2017-05-04 ENCOUNTER — Encounter: Payer: Self-pay | Admitting: Cardiology

## 2017-05-24 ENCOUNTER — Encounter: Payer: Self-pay | Admitting: Cardiology

## 2017-06-14 ENCOUNTER — Ambulatory Visit: Payer: 59 | Admitting: Cardiology

## 2017-06-26 ENCOUNTER — Ambulatory Visit (INDEPENDENT_AMBULATORY_CARE_PROVIDER_SITE_OTHER): Payer: 59 | Admitting: Cardiology

## 2017-06-26 ENCOUNTER — Encounter: Payer: Self-pay | Admitting: Cardiology

## 2017-06-26 VITALS — BP 160/100 | HR 84 | Ht 66.0 in | Wt 253.4 lb

## 2017-06-26 DIAGNOSIS — I472 Ventricular tachycardia, unspecified: Secondary | ICD-10-CM

## 2017-06-26 DIAGNOSIS — Z79899 Other long term (current) drug therapy: Secondary | ICD-10-CM | POA: Diagnosis not present

## 2017-06-26 MED ORDER — FLECAINIDE ACETATE 100 MG PO TABS: 100.0000 mg | ORAL_TABLET | Freq: Two times a day (BID) | ORAL | 6 refills | Status: DC

## 2017-06-26 NOTE — Patient Instructions (Signed)
Medication Instructions:  Your physician has recommended you make the following change in your medication:  1. RESTART Flecainide at 100 mg twice daily -- YOU WILL START THIS MEDICATION          WITHIN 7-10 DAYS PRIOR TO STRESS TEST  -- If you need a refill on your cardiac medications before your next appointment, please call your pharmacy. --  Labwork: None ordered  Testing/Procedures: Your physician has requested that you have an exercise tolerance test - at least 7-10 days from today. For further information please visit [email or web address]. Please also follow instruction sheet, as given.    --- YOU WILL START FLECAINIDE 7-10 DAYS PRIOR TO THIS TEST  Follow-Up: Your physician wants you to follow-up in: 6 months with Dr. Curt Bears.  You will receive a reminder letter in the mail two months in advance. If you don't receive a letter, please call our office to schedule the follow-up appointment.  Thank you for choosing [facility]!!   Trinidad Curet, RN [phone]  Any Other Special Instructions Will Be Listed Below (If Applicable).

## 2017-06-26 NOTE — Progress Notes (Signed)
Electrophysiology Office Note   Date:  06/26/2017   ID:  [facility], DOB 1986-06-29, MRN [id number]  PCP:  Annetta Maw, MD  Cardiologist:  [place] Primary Electrophysiologist:  Mellany Dinsmore [name], MD    Chief Complaint  Patient presents with  . Follow-up    VT  . Chest Pain  . Shortness of Breath     History of Present Illness: Miranda Mccoy is a 31 y.o. female who presents today for electrophysiology evaluation.   Miranda Mccoy is a 31 y.o. female who is being seen today for the evaluation of VT at the request of Annetta Maw, MD. She initially presented to the emergency room with a wide complex tachycardia. She was treated with magnesium. Her EKG demonstrated a mildly prolonged QTc. At her last visit, her verapamil was decreased and she was put on flecainide 100 mg. She did not undergo her exercise treadmill testing and stopped the flecainide. Since that time she has done fairly well. She does have complaints of shortness of breath. She says that she has gained some weight over the past few weeks. She is considering a new weight loss drug.   Today, denies symptoms of palpitations, orthopnea, PND, lower extremity edema, claudication, dizziness, presyncope, syncope, bleeding, or neurologic sequela. The patient is tolerating medications without difficulties.    Past Medical History:  Diagnosis Date  . Allergy   . Anxiety   . Asthma   . GERD (gastroesophageal reflux disease)   . Headache(784.0)   . History of chicken pox   . Skin cancer 2007   Squamous Cell   Past Surgical History:  Procedure Laterality Date  . FRACTURE SURGERY     elbow as child     Current Outpatient Prescriptions  Medication Sig Dispense Refill  . albuterol (PROAIR HFA) 108 (90 BASE) MCG/ACT inhaler Inhale 2 puffs into the lungs every 6 (six) hours as needed.      . benzonatate (TESSALON) 100 MG capsule Take 100 mg by mouth 3 (three) times daily as needed for cough.    .  budesonide-formoterol (SYMBICORT) 160-4.5 MCG/ACT inhaler Inhale 1 puff into the lungs 2 (two) times daily.      Marland Kitchen ibuprofen (ADVIL,MOTRIN) 200 MG tablet Take 800 mg by mouth every 6 (six) hours as needed for moderate pain.    Marland Kitchen levonorgestrel (MIRENA) 20 MCG/24HR IUD 1 Intra Uterine Device by Intrauterine route once.    . montelukast (SINGULAIR) 10 MG tablet Take 10 mg by mouth daily.      . Multiple Vitamins-Minerals (ADULT ONE DAILY GUMMIES) CHEW Chew 2 each by mouth daily.    Marland Kitchen omeprazole (PRILOSEC) 40 MG capsule Take 40 mg by mouth 2 (two) times daily.    . verapamil (CALAN-SR) 120 MG CR tablet Take 1 tablet (120 mg total) by mouth at bedtime. 90 tablet 1   No current facility-administered medications for this visit.     Allergies:   Other   Social History:  The patient  reports that she has been smoking Cigarettes.  She has never used smokeless tobacco. She reports that she uses drugs, including Marijuana. She reports that she does not drink alcohol.   Family History:  The patient's family history includes Anxiety disorder in her sister; Cancer in her mother; Carpal tunnel syndrome in her mother; Diabetes in her maternal grandfather, mother, and paternal grandfather; Heart attack in her maternal grandfather and paternal grandfather; Hyperlipidemia in her maternal grandfather and paternal grandfather; Hypertension in her father,  maternal grandfather, and paternal grandfather; Kidney disease in her mother; Suicidality in her father.    ROS:  Please see the history of present illness.   Otherwise, review of systems is positive for Appetite change, fatigue, chest pain, chest pressure, palpitations, dyspnea on exertion, wheezing, anxiety, back pain, dizziness, headaches.   All other systems are reviewed and negative.   PHYSICAL EXAM: VS:  BP (!) 160/100   Pulse 84   Ht 5\' 6"  (1.676 m)   Wt 253 lb 6.4 oz (114.9 kg)   BMI 40.90 kg/m  , BMI Body mass index is 40.9 kg/m. GEN: Well nourished,  well developed, in no acute distress  HEENT: normal  Neck: no JVD, carotid bruits, or masses Cardiac: RRR; no murmurs, rubs, or gallops,no edema  Respiratory:  clear to auscultation bilaterally, normal work of breathing GI: soft, nontender, nondistended, + BS MS: no deformity or atrophy  Skin: warm and dry Neuro:  Strength and sensation are intact Psych: euthymic mood, full affect  EKG:  EKG is ordered today. Personal review of the ekg ordered shows sinus rhythm, rate 84   Recent Labs: 12/07/2016: BUN 12; Creatinine, Ser 0.75; Hemoglobin 14.5; Platelets 249; Potassium 4.0; Sodium 139    Lipid Panel     Component Value Date/Time   CHOL 122 02/16/2010 1956   TRIG 61 02/16/2010 1956   HDL 45 02/16/2010 1956   CHOLHDL 2.7 Ratio 02/16/2010 1956   VLDL 12 02/16/2010 1956   LDLCALC 65 02/16/2010 1956     Wt Readings from Last 3 Encounters:  06/26/17 253 lb 6.4 oz (114.9 kg)  03/13/17 241 lb 3.2 oz (109.4 kg)  01/02/17 242 lb 9.6 oz (110 kg)      Other studies Reviewed: Additional studies/ records that were reviewed today include: TTE 01/04/17 - Left ventricle: The cavity size was normal. Wall thickness was   increased in a pattern of mild LVH. Systolic function was normal.   The estimated ejection fraction was in the range of 60% to 65%.   Wall motion was normal; there were no regional wall motion   abnormalities. Doppler parameters are consistent with abnormal   left ventricular relaxation (grade 1 diastolic dysfunction). The   E/e&' ratio is between 8-15, suggesting indeterminate LV filling   pressure. - Mitral valve: Mildly thickened leaflets . There was trivial   regurgitation. - Left atrium: The atrium was normal in size. - Inferior vena cava: The vessel was normal in size. The   respirophasic diameter changes were in the normal range (>= 50%),   consistent with normal central venous pressure.   ASSESSMENT AND PLAN:  1.  Ventricular tachycardia: Is currently on  verapamil. Had been on flecainide in the past but did not have her exercise treadmill test. We'll plan to restart flecainide today. She has agreed to undergo exercise treadmill testing. She is also trying to lose weight. I told her that it is okay to go back to the gym on flecainide post-treadmill testing.  Current medicines are reviewed at length with the patient today.   The patient does not have concerns regarding her medicines.  The following changes were made today:    Labs/ tests ordered today include:  No orders of the defined types were placed in this encounter.    Disposition:   FU with Kahleel Fadeley 6 months  Signed, Stacey Sago [name], MD  06/26/2017 12:01 PM     [place] [address] [phone] (  office) [phone] (fax)

## 2017-07-06 ENCOUNTER — Ambulatory Visit: Payer: 59

## 2017-07-06 ENCOUNTER — Telehealth: Payer: Self-pay | Admitting: *Deleted

## 2017-07-06 NOTE — Telephone Encounter (Signed)
PVCs, bigimeny noted prior to scheduled GXT today.   BP 180-190s/100s. Discussed w/ Camnitz. Called pt and advised to stop Flecainide. Appt made for next week to update treatment plan. Patient verbalized understanding and agreeable to plan.

## 2017-07-11 ENCOUNTER — Other Ambulatory Visit: Payer: Self-pay | Admitting: Cardiology

## 2017-07-11 ENCOUNTER — Encounter: Payer: Self-pay | Admitting: Cardiology

## 2017-07-11 ENCOUNTER — Encounter: Payer: Self-pay | Admitting: *Deleted

## 2017-07-11 ENCOUNTER — Ambulatory Visit (INDEPENDENT_AMBULATORY_CARE_PROVIDER_SITE_OTHER): Payer: 59 | Admitting: Cardiology

## 2017-07-11 VITALS — BP 148/98 | HR 88 | Ht 66.0 in | Wt 245.2 lb

## 2017-07-11 DIAGNOSIS — I10 Essential (primary) hypertension: Secondary | ICD-10-CM | POA: Diagnosis not present

## 2017-07-11 DIAGNOSIS — I472 Ventricular tachycardia, unspecified: Secondary | ICD-10-CM

## 2017-07-11 MED ORDER — CARVEDILOL 12.5 MG PO TABS: 12.5000 mg | ORAL_TABLET | Freq: Two times a day (BID) | ORAL | 3 refills | Status: DC

## 2017-07-11 NOTE — Progress Notes (Signed)
Electrophysiology Office Note   Date:  07/11/2017   ID:  [name], DOB October 20, 1985, MRN [id number]  PCP:  Annetta Maw, MD  Cardiologist:  [place] Primary Electrophysiologist:  Casilda Pickerill [name], MD    Chief Complaint  Patient presents with  . Follow-up    VT     History of Present Illness: Miranda Mccoy is a 31 y.o. female who presents today for electrophysiology evaluation.   Miranda Mccoy is a 31 y.o. female who is being seen today for the evaluation of VT at the request of Annetta Maw, MD. She initially presented to the emergency room with a wide complex tachycardia. She was treated with magnesium. Her EKG demonstrated a mildly prolonged QTc. At her last visit, her verapamil was decreased and she was put on flecainide 100 mg. She did not undergo her exercise treadmill testing and stopped the flecainide. Since that time she has done fairly well. She does have complaints of shortness of breath. She says that she has gained some weight over the past few weeks. She is considering a new weight loss drug.  She was started on flecainide, and had a stress test that showed a high burden of PVCs significantly elevated blood pressure thus stress testing was canceled. Since that time, she is continuing to have palpitations. She was feeling better on the flecainide. She is also had shortness of breath with exertion. She does not feel that she has had any further episodes of ventricular tachycardia, only PVCs.  Today, denies symptoms of chest pain, rthopnea, PND, lower extremity edema, claudication, dizziness, presyncope, syncope, bleeding, or neurologic sequela.     Past Medical History:  Diagnosis Date  . Allergy   . Anxiety   . Asthma   . GERD (gastroesophageal reflux disease)   . Headache(784.0)   . History of chicken pox   . Skin cancer 2007   Squamous Cell   Past Surgical History:  Procedure Laterality Date  . FRACTURE SURGERY     elbow as child      Current Outpatient Prescriptions  Medication Sig Dispense Refill  . albuterol (PROAIR HFA) 108 (90 BASE) MCG/ACT inhaler Inhale 2 puffs into the lungs every 6 (six) hours as needed.      . benzonatate (TESSALON) 100 MG capsule Take 100 mg by mouth 3 (three) times daily as needed for cough.    . budesonide-formoterol (SYMBICORT) 160-4.5 MCG/ACT inhaler Inhale 1 puff into the lungs 2 (two) times daily.      Marland Kitchen ibuprofen (ADVIL,MOTRIN) 200 MG tablet Take 800 mg by mouth every 6 (six) hours as needed for moderate pain.    Marland Kitchen levonorgestrel (MIRENA) 20 MCG/24HR IUD 1 Intra Uterine Device by Intrauterine route once.    . montelukast (SINGULAIR) 10 MG tablet Take 10 mg by mouth daily.      . Multiple Vitamins-Minerals (ADULT ONE DAILY GUMMIES) CHEW Chew 2 each by mouth daily.    Marland Kitchen omeprazole (PRILOSEC) 40 MG capsule Take 40 mg by mouth 2 (two) times daily.    . carvedilol (COREG) 12.5 MG tablet Take 1 tablet (12.5 mg total) by mouth 2 (two) times daily. 60 tablet 3   No current facility-administered medications for this visit.     Allergies:   Other   Social History:  The patient  reports that she has been smoking Cigarettes.  She has never used smokeless tobacco. She reports that she uses drugs, including Marijuana. She reports that she does not drink alcohol.  Family History:  The patient's family history includes Anxiety disorder in her sister; Cancer in her mother; Carpal tunnel syndrome in her mother; Diabetes in her maternal grandfather, mother, and paternal grandfather; Heart attack in her maternal grandfather and paternal grandfather; Hyperlipidemia in her maternal grandfather and paternal grandfather; Hypertension in her father, maternal grandfather, and paternal grandfather; Kidney disease in her mother; Suicidality in her father.    ROS:  Please see the history of present illness.   Otherwise, review of systems is positive for Fatigue, chest pressure, shortness of breath,  palpitations, anxiety, dizziness, headaches.   All other systems are reviewed and negative.   PHYSICAL EXAM: VS:  BP (!) 148/98   Pulse 88   Ht 5\' 6"  (1.676 m)   Wt 245 lb 3.2 oz (111.2 kg)   BMI 39.58 kg/m  , BMI Body mass index is 39.58 kg/m. GEN: Well nourished, well developed, in no acute distress  HEENT: normal  Neck: no JVD, carotid bruits, or masses Cardiac: iRRR; no murmurs, rubs, or gallops,no edema  Respiratory:  clear to auscultation bilaterally, normal work of breathing GI: soft, nontender, nondistended, + BS MS: no deformity or atrophy  Skin: warm and dry Neuro:  Strength and sensation are intact Psych: euthymic mood, full affect  EKG:  EKG is not ordered today. Personal review of the ekg ordered 06/26/17 shows sinus rhythm, rate 84  Recent Labs: 12/07/2016: BUN 12; Creatinine, Ser 0.75; Hemoglobin 14.5; Platelets 249; Potassium 4.0; Sodium 139    Lipid Panel     Component Value Date/Time   CHOL 122 02/16/2010 1956   TRIG 61 02/16/2010 1956   HDL 45 02/16/2010 1956   CHOLHDL 2.7 Ratio 02/16/2010 1956   VLDL 12 02/16/2010 1956   LDLCALC 65 02/16/2010 1956     Wt Readings from Last 3 Encounters:  07/11/17 245 lb 3.2 oz (111.2 kg)  06/26/17 253 lb 6.4 oz (114.9 kg)  03/13/17 241 lb 3.2 oz (109.4 kg)      Other studies Reviewed: Additional studies/ records that were reviewed today include: TTE 01/04/17 - Left ventricle: The cavity size was normal. Wall thickness was   increased in a pattern of mild LVH. Systolic function was normal.   The estimated ejection fraction was in the range of 60% to 65%.   Wall motion was normal; there were no regional wall motion   abnormalities. Doppler parameters are consistent with abnormal   left ventricular relaxation (grade 1 diastolic dysfunction). The   E/e&' ratio is between 8-15, suggesting indeterminate LV filling   pressure. - Mitral valve: Mildly thickened leaflets . There was trivial   regurgitation. - Left  atrium: The atrium was normal in size. - Inferior vena cava: The vessel was normal in size. The   respirophasic diameter changes were in the normal range (>= 50%),   consistent with normal central venous pressure.   ASSESSMENT AND PLAN:  1.  Ventricular tachycardia: He is to have a high burden of PVCs based on her stress test. She has not had a Holter monitor to make a determination of the burden of PVCs. We'll plan for further monitoring. We'll also switch her from verapamil to carvedilol if she is not feeling verapamil is made much of a difference. Also discussed the possibility of ablation. Risks and benefits were discussed. Risks include bleeding, tamponade, heart block, and stroke. She understands these risks and would like to think about her options further. He does feel that she would likely have an ablation  in the future.  2. Hypertension: Blood pressure has been quite elevated at home up into the 443X systolic. Her blood pressure was also elevated when she came in for stress testing. Channell Quattrone stop verapamil and start carvedilol. She may need further blood pressure medications.  Current medicines are reviewed at length with the patient today.   The patient does not have concerns regarding her medicines.  The following changes were made today:  Stop verapamil, start carvedilol  Labs/ tests ordered today include:  Orders Placed This Encounter  Procedures  . Holter monitor - 48 hour     Disposition:   FU with Bryam Taborda 3 months  Signed, Olman Yono [name], MD  07/11/2017 9:23 AM     [place] [address] [phone] (office) [phone] (fax)

## 2017-07-11 NOTE — Patient Instructions (Addendum)
Medication Instructions:  Your physician has recommended you make the following change in your medication:  1.)  Stop Verapamil  2.) Start Coreg 12.5mg  twice a day   -- If you need a refill on your cardiac medications before your next appointment, please call your pharmacy. --  Labwork: None ordered  Testing/Procedures: Your physician has recommended that you wear a 48hr. holter monitor. Holter monitors are medical devices that record the heart's electrical activity. Doctors most often use these monitors to diagnose arrhythmias. Arrhythmias are problems with the speed or rhythm of the heartbeat. The monitor is a small, portable device. You can wear one while you do your normal daily activities. This is usually used to diagnose what is causing palpitations/syncope (passing out).    Follow-Up:  Your physician recommends that you schedule a follow-up appointment in: 3 months with Dr. Curt Bears  Thank you for choosing [facility]!!   Any Other Special Instructions Will Be Listed Below (If Applicable).  Nurse will call you in a couple weeks to discuss ablation.   Holter Monitoring A Holter monitor is a small device that is used to detect abnormal heart rhythms. It clips to your clothing and is connected by wires to flat, sticky disks (electrodes) that attach to your chest. It is worn continuously for 24-48 hours. Follow these instructions at home:  Wear your Holter monitor at all times, even while exercising and sleeping, for as long as directed by your health care provider.  Make sure that the Holter monitor is safely clipped to your clothing or close to your body as recommended by your health care provider.  Do not get the monitor or wires wet.  Do not put body lotion or moisturizer on your chest.  Keep your skin clean.  Keep a diary of your daily activities, such as walking and doing chores. If you feel that your heartbeat is abnormal or that your heart is fluttering or  skipping a beat: ? Record what you are doing when it happens. ? Record what time of day the symptoms occur.  Return your Holter monitor as directed by your health care provider.  Keep all follow-up visits as directed by your health care provider. This is important. Get help right away if:  You feel lightheaded or you faint.  You have trouble breathing.  You feel pain in your chest, upper arm, or jaw.  You feel sick to your stomach and your skin is pale, cool, or damp.  You heartbeat feels unusual or abnormal. This information is not intended to replace advice given to you by your health care provider. Make sure you discuss any questions you have with your health care provider. Document Released: 06/17/2004 Document Revised: 02/25/2016 Document Reviewed: 04/28/2014 Elsevier Interactive Patient Education  [employer].

## 2017-07-12 ENCOUNTER — Telehealth: Payer: Self-pay | Admitting: Cardiology

## 2017-07-12 NOTE — Telephone Encounter (Signed)
New message    Patient started taking medication for first time last night. Not sure if medication is causing arm tingling. Please call   Pt c/o medication issue:  1. Name of Medication: carvedilol (COREG) 12.5 MG tablet  2. How are you currently taking this medication (dosage and times per day)? Take 1 tablet (12.5 mg total) by mouth 2 (two) times daily.  3. Are you having a reaction (difficulty breathing--STAT)? no  4. What is your medication issue? Right  hand arm numbness/ tight

## 2017-07-12 NOTE — Telephone Encounter (Signed)
Advised patient of the same information that triage nurse told her. Pt is going to continue to monitor and call the office next week if she is still experiencing an issue. She is going to allow new medication to try and start working. Advised to go to the hospital if she begins to feel issues w/ VT.  Patient verbalized understanding and agreeable to plan.

## 2017-07-12 NOTE — Telephone Encounter (Signed)
Spoke to patient , recommend to try medication again. This medication does not usus al cause the issues reported. Patient would like to discuss the matter in more detail with  Miranda Mccoy. Aware will defer question

## 2017-07-25 ENCOUNTER — Ambulatory Visit (INDEPENDENT_AMBULATORY_CARE_PROVIDER_SITE_OTHER): Payer: 59

## 2017-07-25 DIAGNOSIS — I472 Ventricular tachycardia, unspecified: Secondary | ICD-10-CM

## 2017-07-27 ENCOUNTER — Telehealth: Payer: Self-pay | Admitting: *Deleted

## 2017-07-27 ENCOUNTER — Encounter: Payer: Self-pay | Admitting: *Deleted

## 2017-07-27 DIAGNOSIS — I472 Ventricular tachycardia, unspecified: Secondary | ICD-10-CM

## 2017-07-27 DIAGNOSIS — Z01812 Encounter for preprocedural laboratory examination: Secondary | ICD-10-CM

## 2017-07-27 NOTE — Telephone Encounter (Signed)
Scheduled VT ablation for 08/17/17 w/ Dr. Curt Bears. Reviewed instructions with patient and she is aware we will review instructions again (along w/ letter to give to her) when she comes in for H&P and pre procedure blood work on 11/7. Patient verbalized understanding and agreeable to plan.

## 2017-08-09 ENCOUNTER — Encounter: Payer: Self-pay | Admitting: Cardiology

## 2017-08-09 ENCOUNTER — Ambulatory Visit (INDEPENDENT_AMBULATORY_CARE_PROVIDER_SITE_OTHER): Payer: 59 | Admitting: Cardiology

## 2017-08-09 VITALS — BP 112/80 | HR 84 | Ht 66.0 in | Wt 247.0 lb

## 2017-08-09 DIAGNOSIS — Z01812 Encounter for preprocedural laboratory examination: Secondary | ICD-10-CM | POA: Diagnosis not present

## 2017-08-09 DIAGNOSIS — I472 Ventricular tachycardia, unspecified: Secondary | ICD-10-CM

## 2017-08-09 DIAGNOSIS — I10 Essential (primary) hypertension: Secondary | ICD-10-CM | POA: Diagnosis not present

## 2017-08-09 NOTE — Patient Instructions (Addendum)
Medication Instructions:  Your physician recommends that you continue on your current medications as directed. Please refer to the Current Medication list given to you today.  -- If you need a refill on your cardiac medications before your next appointment, please call your pharmacy. --  Labwork: Your physician recommends that you return for lab work today:  CBC/BMET   Testing/Procedures: None ordered  Follow-Up: Your physician recommends that you schedule a follow-up appointment in:  4 weeks, after your procedure on 11/15 with Dr. Curt Bears    Thank you for choosing [facility]!!   [phone]  Any Other Special Instructions Will Be Listed Below (If Applicable).

## 2017-08-09 NOTE — Progress Notes (Signed)
Electrophysiology Office Note   Date:  08/09/2017   ID:  Miranda Mccoy, DOB 1986/09/22, MRN [id number]  PCP:  Annetta Maw, MD  Cardiologist:  [facility] Primary Electrophysiologist:  Dontre Laduca [name], MD    Chief Complaint  Patient presents with  . Palpitations     History of Present Illness: Miranda Mccoy is a 31 y.o. female who presents today for electrophysiology evaluation.   Miranda Mccoy is a 31 y.o. female who is being seen today for the evaluation of VT at the request of Annetta Maw, MD. She initially presented to the emergency room with a wide complex tachycardia. She was treated with magnesium. Her EKG demonstrated a mildly prolonged QTc. At her last visit, her verapamil was decreased and she was put on flecainide 100 mg. She did not undergo her exercise treadmill testing and stopped the flecainide.  She has been feeling well since last being seen.  She was put on carvedilol which she feels like has improved her symptoms.  She does continue to have mild palpitations, but they are much better.  Her shortness of breath is also improved.  That being said, she is not back to her baseline.  Today, denies symptoms of chest pain, orthopnea, PND, lower extremity edema, claudication, dizziness, presyncope, syncope, bleeding, or neurologic sequela. The patient is tolerating medications without difficulties.     Past Medical History:  Diagnosis Date  . Allergy   . Anxiety   . Asthma   . GERD (gastroesophageal reflux disease)   . Headache(784.0)   . History of chicken pox   . Skin cancer 2007   Squamous Cell   Past Surgical History:  Procedure Laterality Date  . FRACTURE SURGERY     elbow as child     Current Outpatient Medications  Medication Sig Dispense Refill  . albuterol (PROAIR HFA) 108 (90 BASE) MCG/ACT inhaler Inhale 2 puffs into the lungs every 6 (six) hours as needed.      . benzonatate (TESSALON) 100 MG capsule Take 100 mg by mouth 3 (three)  times daily as needed for cough.    . budesonide-formoterol (SYMBICORT) 160-4.5 MCG/ACT inhaler Inhale 1 puff into the lungs 2 (two) times daily.      . carvedilol (COREG) 12.5 MG tablet Take 1 tablet (12.5 mg total) by mouth 2 (two) times daily. 60 tablet 3  . ibuprofen (ADVIL,MOTRIN) 200 MG tablet Take 800 mg by mouth every 6 (six) hours as needed for moderate pain.    Marland Kitchen levonorgestrel (MIRENA) 20 MCG/24HR IUD 1 Intra Uterine Device by Intrauterine route once.    . montelukast (SINGULAIR) 10 MG tablet Take 10 mg by mouth daily.      . Multiple Vitamins-Minerals (ADULT ONE DAILY GUMMIES) CHEW Chew 2 each by mouth daily.    Marland Kitchen omeprazole (PRILOSEC) 40 MG capsule Take 40 mg by mouth 2 (two) times daily.     No current facility-administered medications for this visit.     Allergies:   Other   Social History:  The patient  reports that she has been smoking cigarettes.  she has never used smokeless tobacco. She reports that she uses drugs. Drug: Marijuana. She reports that she does not drink alcohol.   Family History:  The patient's family history includes Anxiety disorder in her sister; Cancer in her mother; Carpal tunnel syndrome in her mother; Diabetes in her maternal grandfather, mother, and paternal grandfather; Heart attack in her maternal grandfather and paternal grandfather; Hyperlipidemia in her maternal  grandfather and paternal grandfather; Hypertension in her father, maternal grandfather, and paternal grandfather; Kidney disease in her mother; Suicidality in her father.    ROS:  Please see the history of present illness.   Otherwise, review of systems is positive for palpitations, anxiety, headaches.   All other systems are reviewed and negative.   PHYSICAL EXAM: VS:  BP 112/80   Pulse 84   Ht 5\' 6"  (1.676 m)   Wt 247 lb (112 kg)   BMI 39.87 kg/m  , BMI Body mass index is 39.87 kg/m. GEN: Well nourished, well developed, in no acute distress  HEENT: normal  Neck: no JVD, carotid  bruits, or masses Cardiac: RRR; no murmurs, rubs, or gallops,no edema  Respiratory:  clear to auscultation bilaterally, normal work of breathing GI: soft, nontender, nondistended, + BS MS: no deformity or atrophy  Skin: warm and dry Neuro:  Strength and sensation are intact Psych: euthymic mood, full affect  EKG:  EKG is not ordered today. Personal review of the ekg ordered 06/26/17 shows sinus rhythm, rate 84  Recent Labs: 12/07/2016: BUN 12; Creatinine, Ser 0.75; Hemoglobin 14.5; Platelets 249; Potassium 4.0; Sodium 139    Lipid Panel     Component Value Date/Time   CHOL 122 02/16/2010 1956   TRIG 61 02/16/2010 1956   HDL 45 02/16/2010 1956   CHOLHDL 2.7 Ratio 02/16/2010 1956   VLDL 12 02/16/2010 1956   LDLCALC 65 02/16/2010 1956     Wt Readings from Last 3 Encounters:  08/09/17 247 lb (112 kg)  07/11/17 245 lb 3.2 oz (111.2 kg)  06/26/17 253 lb 6.4 oz (114.9 kg)      Other studies Reviewed: Additional studies/ records that were reviewed today include: TTE 01/04/17 - Left ventricle: The cavity size was normal. Wall thickness was   increased in a pattern of mild LVH. Systolic function was normal.   The estimated ejection fraction was in the range of 60% to 65%.   Wall motion was normal; there were no regional wall motion   abnormalities. Doppler parameters are consistent with abnormal   left ventricular relaxation (grade 1 diastolic dysfunction). The   E/e&' ratio is between 8-15, suggesting indeterminate LV filling   pressure. - Mitral valve: Mildly thickened leaflets . There was trivial   regurgitation. - Left atrium: The atrium was normal in size. - Inferior vena cava: The vessel was normal in size. The   respirophasic diameter changes were in the normal range (>= 50%),   consistent with normal central venous pressure.  Holter 07/25/17 - personally reviewed Minimum HR: 43 BPM at 7:21:49 AM(2) Maximum HR: 130 BPM at 9:29:22 AM Average HR: 70 BPM 2% PVCs, <1%  APCs Multiple 2-3 beat runs of NSVT  ASSESSMENT AND PLAN:  1.  Ventricular tachycardia: He was [place] and was 2% on her cardiac monitor.  She is felt better on the carvedilol.  That being said, due to her history of VT, we Elnathan Fulford plan for EP study and potential ablation.  Risks and benefits were discussed.  Risks include bleeding, tamponade, heart block, stroke, among others.  She understands the risks and is agreed to the procedure.  2. Hypertension: Blood pressure was quite elevated on prior visits, but has been normalized with current medications.  No changes.  Current medicines are reviewed at length with the patient today.   The patient does not have concerns regarding her medicines.  The following changes were made today: None  Labs/ tests ordered today include:  No orders of the defined types were placed in this encounter.    Disposition:   FU with Tess Potts 1 months  Signed, Dylynn Ketner [name], MD  08/09/2017 8:45 AM     [facility] [address] [place] [place] [address] [phone] (office) [phone] (fax)

## 2017-08-09 NOTE — Addendum Note (Signed)
Addended by: Velna Ochs on: 08/09/2017 08:57 AM   Modules accepted: Orders

## 2017-08-10 LAB — CBC WITH DIFFERENTIAL/PLATELET
BASOS: 0 %
Basophils Absolute: 0 10*3/uL (ref 0.0–0.2)
EOS (ABSOLUTE): 0.2 10*3/uL (ref 0.0–0.4)
EOS: 3 %
Hematocrit: 41.9 % (ref 34.0–46.6)
Hemoglobin: 14.2 g/dL (ref 11.1–15.9)
IMMATURE GRANS (ABS): 0 10*3/uL (ref 0.0–0.1)
IMMATURE GRANULOCYTES: 1 %
LYMPHS: 26 %
Lymphocytes Absolute: 2.2 10*3/uL (ref 0.7–3.1)
MCH: 31.7 pg (ref 26.6–33.0)
MCHC: 33.9 g/dL (ref 31.5–35.7)
MCV: 94 fL (ref 79–97)
MONOS ABS: 0.6 10*3/uL (ref 0.1–0.9)
Monocytes: 7 %
Neutrophils Absolute: 5.4 10*3/uL (ref 1.4–7.0)
Neutrophils: 63 %
PLATELETS: 261 10*3/uL (ref 150–379)
RBC: 4.48 x10E6/uL (ref 3.77–5.28)
RDW: 13.5 % (ref 12.3–15.4)
WBC: 8.4 10*3/uL (ref 3.4–10.8)

## 2017-08-10 LAB — BASIC METABOLIC PANEL
BUN / CREAT RATIO: 30 — AB (ref 9–23)
BUN: 17 mg/dL (ref 6–20)
CO2: 25 mmol/L (ref 20–29)
Calcium: 9.1 mg/dL (ref 8.7–10.2)
Chloride: 103 mmol/L (ref 96–106)
Creatinine, Ser: 0.56 mg/dL — ABNORMAL LOW (ref 0.57–1.00)
GFR calc non Af Amer: 123 mL/min/{1.73_m2} (ref >59)
GFR, EST AFRICAN AMERICAN: 142 mL/min/{1.73_m2} (ref >59)
GLUCOSE: 99 mg/dL (ref 65–99)
POTASSIUM: 4.2 mmol/L (ref 3.5–5.2)
Sodium: 141 mmol/L (ref 134–144)

## 2017-08-17 ENCOUNTER — Ambulatory Visit (HOSPITAL_COMMUNITY): Payer: 59 | Admitting: Anesthesiology

## 2017-08-17 ENCOUNTER — Encounter (HOSPITAL_COMMUNITY): Payer: Self-pay | Admitting: Certified Registered Nurse Anesthetist

## 2017-08-17 ENCOUNTER — Ambulatory Visit (HOSPITAL_COMMUNITY)
Admission: RE | Admit: 2017-08-17 | Discharge: 2017-08-17 | Disposition: A | Discharge status: Home / Self Care | Payer: 59 | Source: Ambulatory Visit | Attending: Cardiology | Admitting: Cardiology

## 2017-08-17 ENCOUNTER — Encounter (HOSPITAL_COMMUNITY)
Admission: RE | Disposition: A | Discharge status: Home / Self Care | Payer: Self-pay | Source: Ambulatory Visit | Attending: Cardiology

## 2017-08-17 DIAGNOSIS — I472 Ventricular tachycardia: Secondary | ICD-10-CM | POA: Insufficient documentation

## 2017-08-17 HISTORY — PX: V TACH ABLATION: EP1227

## 2017-08-17 LAB — PREGNANCY, URINE: PREG TEST UR: NEGATIVE

## 2017-08-17 SURGERY — V TACH ABLATION
Anesthesia: General

## 2017-08-17 MED ORDER — FENTANYL CITRATE (PF) 100 MCG/2ML IJ SOLN
INTRAMUSCULAR | Status: DC | PRN
  Administered 2017-08-17: 50 ug via INTRAVENOUS

## 2017-08-17 MED ORDER — COLCHICINE 0.6 MG PO TABS: 0.6000 mg | ORAL_TABLET | Freq: Every day | ORAL | 0 refills | Status: DC | PRN

## 2017-08-17 MED ORDER — ACETAMINOPHEN 325 MG PO TABS
650.0000 mg | ORAL_TABLET | ORAL | Status: DC | PRN
  Administered 2017-08-17: 650 mg via ORAL

## 2017-08-17 MED ORDER — PROPOFOL 500 MG/50ML IV EMUL
INTRAVENOUS | Status: DC | PRN
  Administered 2017-08-17: 09:00:00 via INTRAVENOUS
  Administered 2017-08-17: 80 ug/kg/min via INTRAVENOUS

## 2017-08-17 MED ORDER — GLYCOPYRROLATE 0.2 MG/ML IJ SOLN
INTRAMUSCULAR | Status: DC | PRN
  Administered 2017-08-17: .2 mg via INTRAVENOUS

## 2017-08-17 MED ORDER — HEPARIN (PORCINE) IN NACL 2-0.9 UNIT/ML-% IJ SOLN
INTRAMUSCULAR | Status: AC
  Filled 2017-08-17: qty 500

## 2017-08-17 MED ORDER — MIDAZOLAM HCL 5 MG/5ML IJ SOLN
INTRAMUSCULAR | Status: DC | PRN
  Administered 2017-08-17 (×2): 1 mg via INTRAVENOUS

## 2017-08-17 MED ORDER — PANTOPRAZOLE SODIUM 40 MG PO TBEC
40.0000 mg | DELAYED_RELEASE_TABLET | Freq: Every day | ORAL | Status: DC
  Administered 2017-08-17: 40 mg via ORAL

## 2017-08-17 MED ORDER — ONDANSETRON HCL 4 MG/2ML IJ SOLN
INTRAMUSCULAR | Status: DC | PRN
  Administered 2017-08-17: 4 mg via INTRAVENOUS

## 2017-08-17 MED ORDER — FENTANYL CITRATE (PF) 100 MCG/2ML IJ SOLN
50.0000 ug | Freq: Once | INTRAMUSCULAR | Status: AC
Start: 2017-08-17 — End: 2017-08-17
  Administered 2017-08-17: 50 ug via INTRAVENOUS

## 2017-08-17 MED ORDER — PROPOFOL 10 MG/ML IV BOLUS
INTRAVENOUS | Status: DC | PRN
  Administered 2017-08-17: 20 mg via INTRAVENOUS
  Administered 2017-08-17 (×3): 15 mg via INTRAVENOUS

## 2017-08-17 MED ORDER — BUPIVACAINE HCL (PF) 0.25 % IJ SOLN
INTRAMUSCULAR | Status: DC | PRN
  Administered 2017-08-17: 60 mL

## 2017-08-17 MED ORDER — PHENYLEPHRINE HCL 10 MG/ML IJ SOLN
INTRAMUSCULAR | Status: DC | PRN
  Administered 2017-08-17: 80 ug via INTRAVENOUS

## 2017-08-17 MED ORDER — ACETAMINOPHEN 325 MG PO TABS
ORAL_TABLET | ORAL | Status: AC
  Administered 2017-08-17: 650 mg via ORAL
  Filled 2017-08-17: qty 2

## 2017-08-17 MED ORDER — HEPARIN SODIUM (PORCINE) 1000 UNIT/ML IJ SOLN
INTRAMUSCULAR | Status: DC | PRN
  Administered 2017-08-17: 1000 [IU] via INTRAVENOUS

## 2017-08-17 MED ORDER — ISOPROTERENOL HCL 0.2 MG/ML IJ SOLN
INTRAVENOUS | Status: DC | PRN
  Administered 2017-08-17: 2 ug/min via INTRAVENOUS

## 2017-08-17 MED ORDER — SODIUM CHLORIDE 0.9% FLUSH: 3.0000 mL | Freq: Two times a day (BID) | INTRAVENOUS | Status: DC

## 2017-08-17 MED ORDER — ISOPROTERENOL HCL 0.2 MG/ML IJ SOLN
INTRAMUSCULAR | Status: AC
Start: 2017-08-17 — End: ?
  Filled 2017-08-17: qty 5

## 2017-08-17 MED ORDER — BUPIVACAINE HCL (PF) 0.25 % IJ SOLN
INTRAMUSCULAR | Status: AC
  Filled 2017-08-17: qty 60

## 2017-08-17 MED ORDER — HEPARIN (PORCINE) IN NACL 2-0.9 UNIT/ML-% IJ SOLN
INTRAMUSCULAR | Status: AC | PRN
  Administered 2017-08-17: 1000 mL

## 2017-08-17 MED ORDER — SODIUM CHLORIDE 0.9 % IV SOLN
INTRAVENOUS | Status: DC | PRN
  Administered 2017-08-17: 08:00:00 via INTRAVENOUS

## 2017-08-17 MED ORDER — SODIUM CHLORIDE 0.9 % IV SOLN
250.0000 mL | INTRAVENOUS | Status: DC | PRN
Start: 2017-08-17 — End: 2017-08-17

## 2017-08-17 MED ORDER — PANTOPRAZOLE SODIUM 40 MG PO TBEC
DELAYED_RELEASE_TABLET | ORAL | Status: AC
  Administered 2017-08-17: 40 mg via ORAL
  Filled 2017-08-17: qty 1

## 2017-08-17 MED ORDER — ONDANSETRON HCL 4 MG/2ML IJ SOLN: 4.0000 mg | Freq: Four times a day (QID) | INTRAMUSCULAR | Status: DC | PRN

## 2017-08-17 MED ORDER — HEPARIN SODIUM (PORCINE) 1000 UNIT/ML IJ SOLN
INTRAMUSCULAR | Status: AC
  Filled 2017-08-17: qty 1

## 2017-08-17 MED ORDER — FENTANYL CITRATE (PF) 100 MCG/2ML IJ SOLN
INTRAMUSCULAR | Status: AC
  Filled 2017-08-17: qty 2

## 2017-08-17 MED ORDER — COLCHICINE 0.6 MG PO TABS
0.6000 mg | ORAL_TABLET | Freq: Two times a day (BID) | ORAL | Status: DC
  Administered 2017-08-17: 0.6 mg via ORAL
  Filled 2017-08-17 (×2): qty 1

## 2017-08-17 MED ORDER — SODIUM CHLORIDE 0.9% FLUSH: 3.0000 mL | INTRAVENOUS | Status: DC | PRN

## 2017-08-17 SURGICAL SUPPLY — 13 items
BLANKET WARM UNDERBOD FULL ACC (MISCELLANEOUS) ×3 IMPLANT
CATH JOSEPHSON QUAD-ALLRED 6FR (CATHETERS) ×3 IMPLANT
CATH SMTCH THERMOCOOL SF DF (CATHETERS) ×3 IMPLANT
CATH SOUNDSTAR ECO REPROCESSED (CATHETERS) ×3 IMPLANT
COVER SWIFTLINK CONNECTOR (BAG) ×3 IMPLANT
PACK EP LATEX FREE (CUSTOM PROCEDURE TRAY) ×2
PACK EP LF (CUSTOM PROCEDURE TRAY) ×1 IMPLANT
PAD DEFIB LIFELINK (PAD) ×3 IMPLANT
PATCH CARTO3 (PAD) ×3 IMPLANT
SHEATH AVANTI 11F 11CM (SHEATH) ×3 IMPLANT
SHEATH PINNACLE 6F 10CM (SHEATH) ×3 IMPLANT
SHEATH PINNACLE 8F 10CM (SHEATH) ×3 IMPLANT
TUBING SMART ABLATE COOLFLOW (TUBING) ×3 IMPLANT

## 2017-08-17 NOTE — Progress Notes (Signed)
Patient seen post VT ablation procedure Pleuritic CP, none otherwise VSS, SR on telemetry B/l groin sites are stable, no bleeding or hematoma D/w patient and family at bedside activity and wound care instructions Patient has been seen by dr. Curt Bears, plan for colchicine 0.6 mg daily PRN/week Follow up has been arranged Plan for discharge after bedrest is completed if patient/sites remain stable  Tommye Standard, PA-C

## 2017-08-17 NOTE — Anesthesia Preprocedure Evaluation (Signed)
Anesthesia Evaluation  Patient identified by MRN, date of birth, ID band Patient awake    Reviewed: Allergy & Precautions, NPO status , Patient's Chart, lab work & pertinent test results  Airway Mallampati: II  TM Distance: >3 FB Neck ROM: Full    Dental  (+) Teeth Intact, Dental Advisory Given   Pulmonary Current Smoker,    breath sounds clear to auscultation       Cardiovascular  Rhythm:Regular Rate:Normal     Neuro/Psych    GI/Hepatic   Endo/Other    Renal/GU      Musculoskeletal   Abdominal (+) + obese,   Peds  Hematology   Anesthesia Other Findings   Reproductive/Obstetrics                             Anesthesia Physical Anesthesia Plan  ASA: III  Anesthesia Plan: General   Post-op Pain Management:    Induction: Intravenous  PONV Risk Score and Plan: Ondansetron, Dexamethasone and Midazolam  Airway Management Planned: Oral ETT  Additional Equipment:   Intra-op Plan:   Post-operative Plan: Extubation in OR  Informed Consent: I have reviewed the patients History and Physical, chart, labs and discussed the procedure including the risks, benefits and alternatives for the proposed anesthesia with the patient or authorized representative who has indicated his/her understanding and acceptance.   Dental advisory given  Plan Discussed with: CRNA and Anesthesiologist  Anesthesia Plan Comments:         Anesthesia Quick Evaluation

## 2017-08-17 NOTE — Transfer of Care (Signed)
Immediate Anesthesia Transfer of Care Note  Patient: Miranda Mccoy  Procedure(s) Performed: V TACH ABLATION (N/A )  Patient Location: Cath Lab  Anesthesia Type:MAC  Level of Consciousness: awake, alert , oriented and patient cooperative  Airway & Oxygen Therapy: Patient Spontanous Breathing and Patient connected to nasal cannula oxygen  Post-op Assessment: Report given to RN and Post -op Vital signs reviewed and stable  Post vital signs: Reviewed and stable  Last Vitals:  Vitals:   08/17/17 0547  BP: (!) 134/97  Pulse: (!) 48  Temp: 36.6 C  SpO2: 96%    Last Pain:  Vitals:   08/17/17 0547  TempSrc: Oral      Patients Stated Pain Goal: 3 (04/23/56 5051)  Complications: No apparent anesthesia complications

## 2017-08-17 NOTE — Progress Notes (Signed)
Site area: rt groin fv sheath Site Prior to Removal:  Level 0 Pressure Applied For: 15 minutes Manual:   yes Patient Status During Pull:  stable Post Pull Site:  Level 0 Post Pull Instructions Given:  yes Post Pull Pulses Present: palpable Dressing Applied:  Gauze and tegaderm Bedrest begins @  Comments:

## 2017-08-17 NOTE — H&P (Signed)
Miranda Mccoy is a 31 y.o. female with a history of PVCs and VT. She presented to the hospital with wide complex tachycardia. She has failed medical therapy. On exam, RRR, no murmurs, lungs clear. Presents for ablation of PVCs. Risks and benefits discussed. Risks include but not limited to bleeding, tamponade, heart block, stroke. She understands the risks and has agreed to the procedure.  Zetta Stoneman Curt Bears, MD 08/17/2017 7:09 AM

## 2017-08-17 NOTE — Discharge Instructions (Signed)
No driving for 4 days. No lifting over 5 lbs for 1 week. No vigorous or sexual activity for 1 week. You may return to work on 08/24/17. Keep procedure site clean & dry. If you notice increased pain, swelling, bleeding or pus, call/return!  You may shower, but no soaking baths/hot tubs/pools for 1 week.     Cardiac Ablation Cardiac ablation is a procedure to stop some heart tissue from causing problems. The heart has many electrical connections. Sometimes these connections make the heart beat very fast or irregularly. Removing some problem areas can improve the heart rhythm or make it normal. What happens before the procedure?  Follow instructions from your doctor about what you cannot eat or drink.  Ask your doctor about: ? Changing or stopping your normal medicines. This is important if you take diabetes medicines or blood thinners. ? Taking medicines such as aspirin and ibuprofen. These medicines can thin your blood. Do not take these medicines before your procedure if your doctor tells you not to.  Plan to have someone take you home.  If you will be going home right after the procedure, plan to have someone with you for 24 hours. What happens during the procedure?  To lower your risk of infection: ? Your health care team will wash or sanitize their hands. ? Your skin will be washed with soap. ? Hair may be removed from your neck or groin.  An IV tube will be put into one of your veins.  You will be given a medicine to help you relax (sedative).  Skin on your neck or groin will be numbed.  A cut (incision) will be made in your neck or groin.  A needle will be put through your cut and into a vein in your neck or groin.  A tube (catheter) will be put into the needle. The tube will be moved to your heart. X-rays (fluoroscopy) will be used to help guide the tube.  Small devices (electrodes) on the tip of the tube will send out electrical currents.  Dye may be put through the tube.  This helps your surgeon see your heart.  Electrical energy will be used to scar (ablate) some heart tissue. Your surgeon may use: ? Heat (radiofrequency energy). ? Laser energy. ? Extreme cold (cryoablation).  The tube will be taken out.  Pressure will be held on your cut. This helps stop bleeding.  A bandage (dressing) will be put on your cut. The procedure may vary. What happens after the procedure?  You will be monitored until your medicines have worn off.  Your cut will be watched for bleeding. You will need to lie still for a few hours.  Do not drive for 24 hours or as long as your doctor tells you. Summary  Cardiac ablation is a procedure to stop some heart tissue from causing problems.  Electrical energy will be used to scar (ablate) some heart tissue. This information is not intended to replace advice given to you by your health care provider. Make sure you discuss any questions you have with your health care provider. Document Released: 05/22/2013 Document Revised: 08/08/2016 Document Reviewed: 08/08/2016 Elsevier Interactive Patient Education  2017 [place].  Femoral Site Care Refer to this sheet in the next few weeks. These instructions provide you with information about caring for yourself after your procedure. Your health care provider may also give you more specific instructions. Your treatment has been planned according to current medical practices, but problems sometimes occur.  Call your health care provider if you have any problems or questions after your procedure. What can I expect after the procedure? After your procedure, it is typical to have the following:  Bruising at the site that usually fades within 1-2 weeks.  Blood collecting in the tissue (hematoma) that may be painful to the touch. It should usually decrease in size and tenderness within 1-2 weeks.  Follow these instructions at home:  Take medicines only as directed by your health care  provider.  You may shower 24-48 hours after the procedure or as directed by your health care provider. Remove the bandage (dressing) and gently wash the site with plain soap and water. Pat the area dry with a clean towel. Do not rub the site, because this may cause bleeding.  Do not take baths, swim, or use a hot tub until your health care provider approves.  Check your insertion site every day for redness, swelling, or drainage.  Do not apply powder or lotion to the site.  Limit use of stairs to twice a day for the first 2-3 days or as directed by your health care provider.  Do not squat for the first 2-3 days or as directed by your health care provider.  Do not lift over 10 lb (4.5 kg) for 5 days after your procedure or as directed by your health care provider.  Ask your health care provider when it is okay to: ? Return to work or school. ? Resume usual physical activities or sports. ? Resume sexual activity.  Do not drive home if you are discharged the same day as the procedure. Have someone else drive you.  You may drive as directed by your health care provider after the procedure.  Do not operate machinery or power tools for 24 hours after the procedure or as directed by your health care provider.  If your procedure was done as an outpatient procedure, which means that you went home the same day as your procedure, a responsible adult should be with you for the first 24 hours after you arrive home.  Keep all follow-up visits as directed by your health care provider. This is important. Contact a health care provider if:  You have a fever.  You have chills.  You have increased bleeding from the site. Hold pressure on the site. Get help right away if:  You have unusual pain at the site.  You have redness, warmth, or swelling at the site.  You have drainage (other than a small amount of blood on the dressing) from the site.  The site is bleeding, and the bleeding does not  stop after 30 minutes of holding steady pressure on the site.  Your leg or foot becomes pale, cool, tingly, or numb. This information is not intended to replace advice given to you by your health care provider. Make sure you discuss any questions you have with your health care provider. Document Released: 05/23/2014 Document Revised: 02/25/2016 Document Reviewed: 04/08/2014 Elsevier Interactive Patient Education  [employer].

## 2017-08-17 NOTE — Anesthesia Procedure Notes (Signed)
Procedure Name: MAC Date/Time: 08/17/2017 8:15 AM Performed by: White, Amedeo Plenty, CRNA Pre-anesthesia Checklist: Patient identified, Emergency Drugs available, Suction available and Patient being monitored Patient Re-evaluated:Patient Re-evaluated prior to induction Oxygen Delivery Method: Nasal cannula

## 2017-08-17 NOTE — Anesthesia Postprocedure Evaluation (Signed)
Anesthesia Post Note  Patient: Miranda Mccoy  Procedure(s) Performed: V TACH ABLATION (N/A )     Patient location during evaluation: PACU Anesthesia Type: General Level of consciousness: awake, awake and alert and oriented Pain management: pain level controlled Vital Signs Assessment: post-procedure vital signs reviewed and stable Respiratory status: spontaneous breathing, nonlabored ventilation and respiratory function stable Cardiovascular status: blood pressure returned to baseline Anesthetic complications: no    Last Vitals:  Vitals:   08/17/17 1645 08/17/17 1700  BP: 132/77 128/68  Pulse: 77 84  Resp: 19 (!) 25  Temp:    SpO2: 95% 98%    Last Pain:  Vitals:   08/17/17 1202  TempSrc: Oral  PainSc: 3                  Anthonia Monger COKER

## 2017-08-17 NOTE — Progress Notes (Signed)
Site area: left groin fv sheaths x2 Site Prior to Removal:  Level 0 Pressure Applied For:  15 minutes Manual:   yes Patient Status During Pull:  stable Post Pull Site:  Level  0 Post Pull Instructions Given:  yes Post Pull Pulses Present: palpable Dressing Applied:  Gauze and tegaderm Bedrest begins @ 6945 Comments:  IV saline locked

## 2017-08-18 ENCOUNTER — Encounter (HOSPITAL_COMMUNITY): Payer: Self-pay | Admitting: Cardiology

## 2017-10-10 ENCOUNTER — Ambulatory Visit (INDEPENDENT_AMBULATORY_CARE_PROVIDER_SITE_OTHER): Payer: 59 | Admitting: Cardiology

## 2017-10-10 ENCOUNTER — Encounter: Payer: Self-pay | Admitting: Cardiology

## 2017-10-10 VITALS — BP 128/86 | HR 83 | Ht 66.0 in | Wt 244.0 lb

## 2017-10-10 DIAGNOSIS — I10 Essential (primary) hypertension: Secondary | ICD-10-CM | POA: Diagnosis not present

## 2017-10-10 DIAGNOSIS — Z9889 Other specified postprocedural states: Secondary | ICD-10-CM

## 2017-10-10 DIAGNOSIS — Z8679 Personal history of other diseases of the circulatory system: Secondary | ICD-10-CM | POA: Diagnosis not present

## 2017-10-10 DIAGNOSIS — I472 Ventricular tachycardia, unspecified: Secondary | ICD-10-CM

## 2017-10-10 NOTE — Progress Notes (Signed)
Electrophysiology Office Note   Date:  10/10/2017   ID:  [name], DOB March 17, 1986, MRN [id number]  PCP:  Miranda Maw, MD  Cardiologist:  [facility] Primary Electrophysiologist:  Miranda Mccoy Miranda Leeds, MD    Chief Complaint  Patient presents with  . Follow-up    VT/Post VT ablation     History of Present Illness: [name] is a 32 y.o. female who presents today for electrophysiology evaluation.   [name] is a 32 y.o. female who is being seen today for the evaluation of VT at the request of Miranda Maw, MD. She initially presented to the emergency room with a wide complex tachycardia. She was treated with magnesium. Her EKG demonstrated a mildly prolonged QTc. At her last visit, her verapamil was decreased and she was put on flecainide 100 mg. She did not undergo her exercise treadmill testing and stopped the flecainide.  She had ablation for PVCs/VT on 08/17/17.  VT mapped to the posterior RVOT.  Today, denies symptoms of palpitations, chest pain, shortness of breath, orthopnea, PND, lower extremity edema, claudication, dizziness, presyncope, syncope, bleeding, or neurologic sequela. The patient is tolerating medications without difficulties.  She is currently feeling well without major symptoms.  She does occasionally have palpitations that she feels is due to anxiety.  Otherwise she has no complaints.  Past Medical History:  Diagnosis Date  . Allergy   . Anxiety   . Asthma   . GERD (gastroesophageal reflux disease)   . Headache(784.0)   . History of chicken pox   . Skin cancer 2007   Squamous Cell   Past Surgical History:  Procedure Laterality Date  . FRACTURE SURGERY     elbow as child  . V TACH ABLATION N/A 08/17/2017   Procedure: V TACH ABLATION;  Surgeon: Miranda Haw, MD;  Location: [facility];  Service: Cardiovascular;  Laterality: N/A;     Current Outpatient Medications  Medication Sig Dispense Refill  . albuterol  (PROAIR HFA) 108 (90 BASE) MCG/ACT inhaler Inhale 2 puffs into the lungs every 6 (six) hours as needed.      . budesonide-formoterol (SYMBICORT) 160-4.5 MCG/ACT inhaler Inhale 1 puff into the lungs 2 (two) times daily.      . carvedilol (COREG) 12.5 MG tablet Take 1 tablet (12.5 mg total) by mouth 2 (two) times daily. 60 tablet 3  . ibuprofen (ADVIL,MOTRIN) 200 MG tablet Take 800 mg by mouth every 6 (six) hours as needed for moderate pain.    Marland Kitchen levonorgestrel (MIRENA) 20 MCG/24HR IUD 1 Intra Uterine Device by Intrauterine route once.    . montelukast (SINGULAIR) 10 MG tablet Take 10 mg by mouth daily.      . Multiple Vitamins-Minerals (ADULT ONE DAILY GUMMIES) CHEW Chew 2 each by mouth daily.    Marland Kitchen omeprazole (PRILOSEC) 40 MG capsule Take 40 mg by mouth 2 (two) times daily.     No current facility-administered medications for this visit.     Allergies:   Other   Social History:  The patient  reports that she has been smoking cigarettes.  she has never used smokeless tobacco. She reports that she uses drugs. Drug: Marijuana. She reports that she does not drink alcohol.   Family History:  The patient's family history includes Anxiety disorder in her sister; Cancer in her mother; Carpal tunnel syndrome in her mother; Diabetes in her maternal grandfather, mother, and paternal grandfather; Heart attack in her maternal grandfather and paternal grandfather; Hyperlipidemia  in her maternal grandfather and paternal grandfather; Hypertension in her father, maternal grandfather, and paternal grandfather; Kidney disease in her mother; Suicidality in her father.    ROS:  Please see the history of present illness.   Otherwise, review of systems is positive for none.   All other systems are reviewed and negative.   PHYSICAL EXAM: VS:  BP 128/86   Pulse 83   Ht 5\' 6"  (1.676 m)   Wt 244 lb (110.7 kg)   BMI 39.38 kg/m  , BMI Body mass index is 39.38 kg/m. GEN: Well nourished, well developed, in no acute  distress  HEENT: normal  Neck: no JVD, carotid bruits, or masses Cardiac: RRR; no murmurs, rubs, or gallops,no edema  Respiratory:  clear to auscultation bilaterally, normal work of breathing GI: soft, nontender, nondistended, + BS MS: no deformity or atrophy  Skin: warm and dry Neuro:  Strength and sensation are intact Psych: euthymic mood, full affect  EKG:  EKG is ordered today. Personal review of the ekg ordered shows sinus rhythm, prolonged QTc 491, rate 83  Recent Labs: 08/09/2017: BUN 17; Creatinine, Ser 0.56; Hemoglobin 14.2; Platelets 261; Potassium 4.2; Sodium 141    Lipid Panel     Component Value Date/Time   CHOL 122 02/16/2010 1956   TRIG 61 02/16/2010 1956   HDL 45 02/16/2010 1956   CHOLHDL 2.7 Ratio 02/16/2010 1956   VLDL 12 02/16/2010 1956   LDLCALC 65 02/16/2010 1956     Wt Readings from Last 3 Encounters:  10/10/17 244 lb (110.7 kg)  08/17/17 240 lb (108.9 kg)  08/09/17 247 lb (112 kg)      Other studies Reviewed: Additional studies/ records that were reviewed today include: TTE 01/04/17 - Left ventricle: The cavity size was normal. Wall thickness was   increased in a pattern of mild LVH. Systolic function was normal.   The estimated ejection fraction was in the range of 60% to 65%.   Wall motion was normal; there were no regional wall motion   abnormalities. Doppler parameters are consistent with abnormal   left ventricular relaxation (grade 1 diastolic dysfunction). The   E/e&' ratio is between 8-15, suggesting indeterminate LV filling   pressure. - Mitral valve: Mildly thickened leaflets . There was trivial   regurgitation. - Left atrium: The atrium was normal in size. - Inferior vena cava: The vessel was normal in size. The   respirophasic diameter changes were in the normal range (>= 50%),   consistent with normal central venous pressure.  Holter 07/25/17 - personally reviewed Minimum HR: 43 BPM at 7:21:49 AM(2) Maximum HR: 130 BPM at  9:29:22 AM Average HR: 70 BPM 2% PVCs, <1% APCs Multiple 2-3 beat runs of NSVT  ASSESSMENT AND PLAN:  1.  Ventricular tachycardia: Had a VT ablation on 08/17/17 which was mapped to the posterior RVOT.  EKG today shows no major abnormalities.  She does have a prolonged QTC.  We Jevan Gaunt continue with current therapy.  2. Hypertension: Better controlled today.  She Takima Encina try coming off of her Coreg and check her blood pressure at home.  Should her blood pressure rise to greater than 140 she Jayana Kotula continue the Coreg.  Current medicines are reviewed at length with the patient today.   The patient does not have concerns regarding her medicines.  The following changes were made today: none  Labs/ tests ordered today include:  Orders Placed This Encounter  Procedures  . EKG 12-Lead     Disposition:  FU with Rebbecca Osuna 6 months  Signed, Aland Chestnutt Miranda Leeds, MD  10/10/2017 4:00 PM     [address] [place] [address] [phone] (office) [phone] (fax)

## 2017-10-10 NOTE — Patient Instructions (Signed)
Medication Instructions:  Your physician recommends that you continue on your current medications as directed. Please refer to the Current Medication list given to you today.  If you need a refill on your cardiac medications before your next appointment, please call your pharmacy.   Labwork: None ordered  Testing/Procedures: None ordered  Follow-Up: Your physician wants you to follow-up in: 6 months with Dr. Camnitz.  You will receive a reminder letter in the mail two months in advance. If you don't receive a letter, please call our office to schedule the follow-up appointment.  Thank you for choosing [facility]!!   Khalid Lacko, RN [phone]         

## 2018-02-20 DIAGNOSIS — I10 Essential (primary) hypertension: Secondary | ICD-10-CM | POA: Insufficient documentation

## 2018-02-20 DIAGNOSIS — Z6841 Body Mass Index (BMI) 40.0 and over, adult: Secondary | ICD-10-CM

## 2018-02-20 DIAGNOSIS — K219 Gastro-esophageal reflux disease without esophagitis: Secondary | ICD-10-CM | POA: Insufficient documentation

## 2018-05-18 DIAGNOSIS — J45909 Unspecified asthma, uncomplicated: Secondary | ICD-10-CM | POA: Insufficient documentation

## 2018-05-18 DIAGNOSIS — Z8679 Personal history of other diseases of the circulatory system: Secondary | ICD-10-CM | POA: Insufficient documentation

## 2018-05-18 DIAGNOSIS — Z9889 Other specified postprocedural states: Secondary | ICD-10-CM | POA: Insufficient documentation

## 2018-07-11 ENCOUNTER — Ambulatory Visit (INDEPENDENT_AMBULATORY_CARE_PROVIDER_SITE_OTHER): Payer: 59 | Admitting: Cardiology

## 2018-07-11 ENCOUNTER — Encounter: Payer: Self-pay | Admitting: Cardiology

## 2018-07-11 VITALS — BP 124/80 | HR 104 | Ht 66.0 in | Wt 263.0 lb

## 2018-07-11 DIAGNOSIS — I472 Ventricular tachycardia, unspecified: Secondary | ICD-10-CM

## 2018-07-11 DIAGNOSIS — I10 Essential (primary) hypertension: Secondary | ICD-10-CM

## 2018-07-11 NOTE — Progress Notes (Signed)
Electrophysiology Office Note   Date:  07/11/2018   ID:  Miranda Mccoy, DOB 1986/05/21, MRN [id number]  PCP:  Annetta Maw, MD  Cardiologist:  [facility] Primary Electrophysiologist:  Will [name], MD    No chief complaint on file.    History of Present Illness: Miranda Mccoy is a 32 y.o. female who presents today for electrophysiology evaluation.   Miranda Mccoy is a 32 y.o. female who is being seen today for the evaluation of VT at the request of Annetta Maw, MD. She initially presented to the emergency room with a wide complex tachycardia. She was treated with magnesium. Her EKG demonstrated a mildly prolonged QTc. At her last visit, her verapamil was decreased and she was put on flecainide 100 mg. She did not undergo her exercise treadmill testing and stopped the flecainide.  She had ablation for PVCs/VT on 08/17/17.  VT mapped to the posterior RVOT.  Today, denies symptoms of palpitations, chest pain, shortness of breath, orthopnea, PND, lower extremity edema, claudication, dizziness, presyncope, syncope, bleeding, or neurologic sequela. The patient is tolerating medications without difficulties.  Overall she is feeling well.  She has noted no further episodes of PVCs or VT.  She is able to do all of her daily activities without restriction.  She does say that she is scheduled for bariatric surgery coming up at the end of November.  Past Medical History:  Diagnosis Date  . Allergy   . Anxiety   . Asthma   . GERD (gastroesophageal reflux disease)   . Headache(784.0)   . History of chicken pox   . Skin cancer 2007   Squamous Cell   Past Surgical History:  Procedure Laterality Date  . FRACTURE SURGERY     elbow as child  . V TACH ABLATION N/A 08/17/2017   Procedure: V TACH ABLATION;  Surgeon: Constance Haw, MD;  Location: [facility];  Service: Cardiovascular;  Laterality: N/A;     Current Outpatient Medications  Medication Sig Dispense  Refill  . albuterol (PROAIR HFA) 108 (90 BASE) MCG/ACT inhaler Inhale 2 puffs into the lungs every 6 (six) hours as needed.      . budesonide-formoterol (SYMBICORT) 160-4.5 MCG/ACT inhaler Inhale 1 puff into the lungs 2 (two) times daily.      Marland Kitchen levonorgestrel (MIRENA) 20 MCG/24HR IUD 1 Intra Uterine Device by Intrauterine route once.    Marland Kitchen lisinopril (PRINIVIL,ZESTRIL) 20 MG tablet Take 20 mg by mouth daily.  1  . montelukast (SINGULAIR) 10 MG tablet Take 10 mg by mouth daily.      Marland Kitchen omeprazole (PRILOSEC) 40 MG capsule Take 40 mg by mouth 2 (two) times daily.     No current facility-administered medications for this visit.     Allergies:   Other and Naltrexone-bupropion hcl er   Social History:  The patient  reports that she has been smoking cigarettes. She has never used smokeless tobacco. She reports that she has current or past drug history. Drug: Marijuana. She reports that she does not drink alcohol.   Family History:  The patient's family history includes Anxiety disorder in her sister; Cancer in her mother; Carpal tunnel syndrome in her mother; Diabetes in her maternal grandfather, mother, and paternal grandfather; Heart attack in her maternal grandfather and paternal grandfather; Hyperlipidemia in her maternal grandfather and paternal grandfather; Hypertension in her father, maternal grandfather, and paternal grandfather; Kidney disease in her mother; Suicidality in her father.   ROS:  Please see  the history of present illness.   Otherwise, review of systems is positive for none.   All other systems are reviewed and negative.   PHYSICAL EXAM: VS:  BP 124/80   Pulse (!) 104   Ht 5\' 6"  (1.676 m)   Wt 263 lb (119.3 kg)   BMI 42.45 kg/m  , BMI Body mass index is 42.45 kg/m. GEN: Well nourished, well developed, in no acute distress  HEENT: normal  Neck: no JVD, carotid bruits, or masses Cardiac: RRR; no murmurs, rubs, or gallops,no edema  Respiratory:  clear to auscultation  bilaterally, normal work of breathing GI: soft, nontender, nondistended, + BS MS: no deformity or atrophy  Skin: warm and dry Neuro:  Strength and sensation are intact Psych: euthymic mood, full affect  EKG:  EKG is ordered today. Personal review of the ekg ordered shows anus rhythm, right axis deviation, rate 104  Recent Labs: 08/09/2017: BUN 17; Creatinine, Ser 0.56; Hemoglobin 14.2; Platelets 261; Potassium 4.2; Sodium 141    Lipid Panel     Component Value Date/Time   CHOL 122 02/16/2010 1956   TRIG 61 02/16/2010 1956   HDL 45 02/16/2010 1956   CHOLHDL 2.7 Ratio 02/16/2010 1956   VLDL 12 02/16/2010 1956   LDLCALC 65 02/16/2010 1956     Wt Readings from Last 3 Encounters:  07/11/18 263 lb (119.3 kg)  10/10/17 244 lb (110.7 kg)  08/17/17 240 lb (108.9 kg)      Other studies Reviewed: Additional studies/ records that were reviewed today include: TTE 01/04/17 - Left ventricle: The cavity size was normal. Wall thickness was   increased in a pattern of mild LVH. Systolic function was normal.   The estimated ejection fraction was in the range of 60% to 65%.   Wall motion was normal; there were no regional wall motion   abnormalities. Doppler parameters are consistent with abnormal   left ventricular relaxation (grade 1 diastolic dysfunction). The   E/e&' ratio is between 8-15, suggesting indeterminate LV filling   pressure. - Mitral valve: Mildly thickened leaflets . There was trivial   regurgitation. - Left atrium: The atrium was normal in size. - Inferior vena cava: The vessel was normal in size. The   respirophasic diameter changes were in the normal range (>= 50%),   consistent with normal central venous pressure.  Holter 07/25/17 - personally reviewed Minimum HR: 43 BPM at 7:21:49 AM(2) Maximum HR: 130 BPM at 9:29:22 AM Average HR: 70 BPM 2% PVCs, <1% APCs Multiple 2-3 beat runs of NSVT  ASSESSMENT AND PLAN:  1.  Ventricular tachycardia: VT ablation  08/17/2017.  This was mapped to the posterior RVOT.  EKG shows sinus rhythm with no PVCs.  No changes at this time.    2. Hypertension: Currently well controlled.  Current medicines are reviewed at length with the patient today.   The patient does not have concerns regarding her medicines.  The following changes were made today: None  Labs/ tests ordered today include:  Orders Placed This Encounter  Procedures  . EKG 12-Lead     Disposition:   FU with Will Camnitz 12 months  Signed, Will [name], MD  07/11/2018 11:36 AM     [facility] [address] [place] [address] [phone] (office) [phone] (fax)

## 2018-07-11 NOTE — Patient Instructions (Signed)
Medication Instructions:  Your physician recommends that you continue on your current medications as directed. Please refer to the Current Medication list given to you today.  If you need a refill on your cardiac medications before your next appointment, please call your pharmacy.   Lab work: None ordered  Testing/Procedures: None ordered  Follow-Up: At [facility], you and your health needs are our priority.  As part of our continuing mission to provide you with exceptional heart care, we have created designated Provider Care Teams.  These Care Teams include your primary Cardiologist (physician) and Advanced Practice Providers (APPs -  Physician Assistants and Nurse Practitioners) who all work together to provide you with the care you need, when you need it. You will need a follow up appointment in 12 months.  Please call our office 2 months in advance to schedule this appointment.  You may see Will [name], MD or one of the following Advanced Practice Providers on your designated Care Team:   Chanetta Marshall, NP . Tommye Standard, PA-C  Thank you for choosing [facility]!!   Trinidad Curet, RN [phone]

## 2019-01-08 ENCOUNTER — Other Ambulatory Visit: Payer: Self-pay

## 2019-01-08 ENCOUNTER — Other Ambulatory Visit: Payer: Self-pay | Admitting: Orthopedic Surgery

## 2019-01-08 ENCOUNTER — Encounter (HOSPITAL_BASED_OUTPATIENT_CLINIC_OR_DEPARTMENT_OTHER): Payer: Self-pay | Admitting: *Deleted

## 2019-01-09 ENCOUNTER — Encounter (HOSPITAL_BASED_OUTPATIENT_CLINIC_OR_DEPARTMENT_OTHER)
Admission: RE | Disposition: A | Discharge status: Home / Self Care | Payer: Self-pay | Source: Home / Self Care | Attending: Orthopedic Surgery

## 2019-01-09 ENCOUNTER — Ambulatory Visit (HOSPITAL_BASED_OUTPATIENT_CLINIC_OR_DEPARTMENT_OTHER)
Admission: RE | Admit: 2019-01-09 | Discharge: 2019-01-09 | Disposition: A | Discharge status: Home / Self Care | Payer: 59 | Attending: Orthopedic Surgery | Admitting: Orthopedic Surgery

## 2019-01-09 ENCOUNTER — Ambulatory Visit (HOSPITAL_BASED_OUTPATIENT_CLINIC_OR_DEPARTMENT_OTHER): Payer: 59 | Admitting: Anesthesiology

## 2019-01-09 ENCOUNTER — Encounter (HOSPITAL_BASED_OUTPATIENT_CLINIC_OR_DEPARTMENT_OTHER): Payer: Self-pay

## 2019-01-09 ENCOUNTER — Other Ambulatory Visit: Payer: Self-pay

## 2019-01-09 DIAGNOSIS — K219 Gastro-esophageal reflux disease without esophagitis: Secondary | ICD-10-CM | POA: Diagnosis not present

## 2019-01-09 DIAGNOSIS — W19XXXA Unspecified fall, initial encounter: Secondary | ICD-10-CM | POA: Diagnosis not present

## 2019-01-09 DIAGNOSIS — S62625A Displaced fracture of medial phalanx of left ring finger, initial encounter for closed fracture: Secondary | ICD-10-CM | POA: Insufficient documentation

## 2019-01-09 DIAGNOSIS — Z7951 Long term (current) use of inhaled steroids: Secondary | ICD-10-CM | POA: Diagnosis not present

## 2019-01-09 DIAGNOSIS — F17210 Nicotine dependence, cigarettes, uncomplicated: Secondary | ICD-10-CM | POA: Insufficient documentation

## 2019-01-09 DIAGNOSIS — F419 Anxiety disorder, unspecified: Secondary | ICD-10-CM | POA: Diagnosis not present

## 2019-01-09 DIAGNOSIS — Z9884 Bariatric surgery status: Secondary | ICD-10-CM | POA: Insufficient documentation

## 2019-01-09 DIAGNOSIS — Z793 Long term (current) use of hormonal contraceptives: Secondary | ICD-10-CM | POA: Insufficient documentation

## 2019-01-09 DIAGNOSIS — J45909 Unspecified asthma, uncomplicated: Secondary | ICD-10-CM | POA: Diagnosis not present

## 2019-01-09 DIAGNOSIS — Z79899 Other long term (current) drug therapy: Secondary | ICD-10-CM | POA: Diagnosis not present

## 2019-01-09 HISTORY — DX: Unspecified staphylococcus as the cause of diseases classified elsewhere: B95.8

## 2019-01-09 HISTORY — DX: Essential (primary) hypertension: I10

## 2019-01-09 HISTORY — DX: Nausea with vomiting, unspecified: Z98.890

## 2019-01-09 HISTORY — DX: Other specified postprocedural states: R11.2

## 2019-01-09 HISTORY — PX: CLOSED REDUCTION FINGER WITH PERCUTANEOUS PINNING: SHX5612

## 2019-01-09 SURGERY — CLOSED REDUCTION, FINGER, WITH PERCUTANEOUS PINNING
Anesthesia: General | Site: Finger | Laterality: Left

## 2019-01-09 MED ORDER — LIDOCAINE 2% (20 MG/ML) 5 ML SYRINGE
INTRAMUSCULAR | Status: DC | PRN
  Administered 2019-01-09: 60 mg via INTRAVENOUS

## 2019-01-09 MED ORDER — CEFAZOLIN SODIUM-DEXTROSE 2-4 GM/100ML-% IV SOLN
2.0000 g | INTRAVENOUS | Status: AC
  Administered 2019-01-09: 11:00:00 2 g via INTRAVENOUS

## 2019-01-09 MED ORDER — SCOPOLAMINE 1 MG/3DAYS TD PT72
MEDICATED_PATCH | TRANSDERMAL | Status: AC
  Filled 2019-01-09: qty 1

## 2019-01-09 MED ORDER — MIDAZOLAM HCL 2 MG/2ML IJ SOLN
1.0000 mg | INTRAMUSCULAR | Status: DC | PRN
  Administered 2019-01-09: 10:00:00 2 mg via INTRAVENOUS

## 2019-01-09 MED ORDER — ONDANSETRON HCL 4 MG/2ML IJ SOLN
4.0000 mg | Freq: Once | INTRAMUSCULAR | Status: AC | PRN
  Administered 2019-01-09: 4 mg via INTRAVENOUS

## 2019-01-09 MED ORDER — ACETAMINOPHEN 500 MG PO TABS
1000.0000 mg | ORAL_TABLET | Freq: Once | ORAL | Status: AC
  Administered 2019-01-09: 09:00:00 1000 mg via ORAL

## 2019-01-09 MED ORDER — LACTATED RINGERS IV SOLN
INTRAVENOUS | Status: DC
  Administered 2019-01-09: 09:00:00 via INTRAVENOUS

## 2019-01-09 MED ORDER — FENTANYL CITRATE (PF) 100 MCG/2ML IJ SOLN
50.0000 ug | INTRAMUSCULAR | Status: DC | PRN
  Administered 2019-01-09: 100 ug via INTRAVENOUS

## 2019-01-09 MED ORDER — ACETAMINOPHEN 500 MG PO TABS
ORAL_TABLET | ORAL | Status: AC
  Filled 2019-01-09: qty 2

## 2019-01-09 MED ORDER — ONDANSETRON HCL 4 MG/2ML IJ SOLN
INTRAMUSCULAR | Status: AC
  Filled 2019-01-09: qty 2

## 2019-01-09 MED ORDER — CEFAZOLIN SODIUM 1 G IJ SOLR
INTRAMUSCULAR | Status: AC
  Filled 2019-01-09: qty 20

## 2019-01-09 MED ORDER — SCOPOLAMINE 1 MG/3DAYS TD PT72
1.0000 | MEDICATED_PATCH | Freq: Once | TRANSDERMAL | Status: AC | PRN
  Administered 2019-01-09: 1 via TRANSDERMAL

## 2019-01-09 MED ORDER — PROMETHAZINE HCL 25 MG/ML IJ SOLN
INTRAMUSCULAR | Status: AC
  Filled 2019-01-09: qty 1

## 2019-01-09 MED ORDER — SODIUM CHLORIDE (PF) 0.9 % IJ SOLN
INTRAMUSCULAR | Status: AC
  Filled 2019-01-09: qty 10

## 2019-01-09 MED ORDER — OXYCODONE HCL 5 MG PO TABS
5.0000 mg | ORAL_TABLET | Freq: Once | ORAL | Status: AC | PRN
  Administered 2019-01-09: 12:00:00 5 mg via ORAL

## 2019-01-09 MED ORDER — LIDOCAINE 2% (20 MG/ML) 5 ML SYRINGE
INTRAMUSCULAR | Status: AC
  Filled 2019-01-09: qty 5

## 2019-01-09 MED ORDER — DEXAMETHASONE SODIUM PHOSPHATE 10 MG/ML IJ SOLN
INTRAMUSCULAR | Status: AC
  Filled 2019-01-09: qty 1

## 2019-01-09 MED ORDER — OXYCODONE-ACETAMINOPHEN 5-325 MG PO TABS: 1.0000 | ORAL_TABLET | ORAL | 0 refills | Status: DC | PRN

## 2019-01-09 MED ORDER — PROMETHAZINE HCL 25 MG/ML IJ SOLN
6.2500 mg | Freq: Once | INTRAMUSCULAR | Status: AC | PRN
  Administered 2019-01-09: 6.25 mg via INTRAVENOUS

## 2019-01-09 MED ORDER — ONDANSETRON HCL 4 MG/2ML IJ SOLN
INTRAMUSCULAR | Status: DC | PRN
  Administered 2019-01-09: 4 mg via INTRAVENOUS

## 2019-01-09 MED ORDER — BUPIVACAINE HCL (PF) 0.25 % IJ SOLN
INTRAMUSCULAR | Status: DC | PRN
  Administered 2019-01-09: 8 mL

## 2019-01-09 MED ORDER — MIDAZOLAM HCL 2 MG/2ML IJ SOLN
INTRAMUSCULAR | Status: AC
  Filled 2019-01-09: qty 2

## 2019-01-09 MED ORDER — OXYCODONE HCL 5 MG PO TABS
ORAL_TABLET | ORAL | Status: AC
  Filled 2019-01-09: qty 1

## 2019-01-09 MED ORDER — DEXAMETHASONE SODIUM PHOSPHATE 10 MG/ML IJ SOLN
INTRAMUSCULAR | Status: DC | PRN
  Administered 2019-01-09: 5 mg via INTRAVENOUS

## 2019-01-09 MED ORDER — FENTANYL CITRATE (PF) 100 MCG/2ML IJ SOLN: 25.0000 ug | INTRAMUSCULAR | Status: DC | PRN

## 2019-01-09 MED ORDER — FENTANYL CITRATE (PF) 100 MCG/2ML IJ SOLN
INTRAMUSCULAR | Status: AC
  Filled 2019-01-09: qty 2

## 2019-01-09 MED ORDER — PROPOFOL 10 MG/ML IV BOLUS
INTRAVENOUS | Status: DC | PRN
  Administered 2019-01-09: 200 mg via INTRAVENOUS

## 2019-01-09 SURGICAL SUPPLY — 64 items
APL SKNCLS STERI-STRIP NONHPOA (GAUZE/BANDAGES/DRESSINGS)
BANDAGE ACE 3X5.8 VEL STRL LF (GAUZE/BANDAGES/DRESSINGS) ×3 IMPLANT
BANDAGE ACE 4X5 VEL STRL LF (GAUZE/BANDAGES/DRESSINGS) IMPLANT
BENZOIN TINCTURE PRP APPL 2/3 (GAUZE/BANDAGES/DRESSINGS) IMPLANT
BLADE SURG 15 STRL LF DISP TIS (BLADE) ×1 IMPLANT
BLADE SURG 15 STRL SS (BLADE) ×3
BNDG CMPR 9X4 STRL LF SNTH (GAUZE/BANDAGES/DRESSINGS)
BNDG ELASTIC 2X5.8 VLCR STR LF (GAUZE/BANDAGES/DRESSINGS) IMPLANT
BNDG ESMARK 4X9 LF (GAUZE/BANDAGES/DRESSINGS) IMPLANT
BNDG GAUZE ELAST 4 BULKY (GAUZE/BANDAGES/DRESSINGS) IMPLANT
CANISTER SUCT 1200ML W/VALVE (MISCELLANEOUS) IMPLANT
CLOSURE WOUND 1/2 X4 (GAUZE/BANDAGES/DRESSINGS)
CORD BIPOLAR FORCEPS 12FT (ELECTRODE) IMPLANT
COVER BACK TABLE REUSABLE LG (DRAPES) ×3 IMPLANT
COVER WAND RF STERILE (DRAPES) IMPLANT
CUFF TOURN SGL QUICK 18X4 (TOURNIQUET CUFF) IMPLANT
DECANTER SPIKE VIAL GLASS SM (MISCELLANEOUS) IMPLANT
DRAPE EXTREMITY T 121X128X90 (DISPOSABLE) ×3 IMPLANT
DRAPE OEC MINIVIEW 54X84 (DRAPES) ×3 IMPLANT
DRAPE SURG 17X23 STRL (DRAPES) ×3 IMPLANT
DURAPREP 26ML APPLICATOR (WOUND CARE) ×3 IMPLANT
GAUZE 4X4 16PLY RFD (DISPOSABLE) IMPLANT
GAUZE SPONGE 4X4 12PLY STRL (GAUZE/BANDAGES/DRESSINGS) ×3 IMPLANT
GAUZE XEROFORM 1X8 LF (GAUZE/BANDAGES/DRESSINGS) IMPLANT
GLOVE BIOGEL PI IND STRL 7.5 (GLOVE) ×1 IMPLANT
GLOVE BIOGEL PI INDICATOR 7.5 (GLOVE) ×2
GLOVE SURG SYN 8.0 (GLOVE) ×6 IMPLANT
GOWN STRL REIN XL XLG (GOWN DISPOSABLE) ×6 IMPLANT
GOWN STRL REUS W/ TWL LRG LVL3 (GOWN DISPOSABLE) ×1 IMPLANT
GOWN STRL REUS W/TWL LRG LVL3 (GOWN DISPOSABLE) ×3
K-WIRE .035 (WIRE) ×6
KWIRE .035 (WIRE) ×2 IMPLANT
NEEDLE HYPO 25X1 1.5 SAFETY (NEEDLE) IMPLANT
NS IRRIG 1000ML POUR BTL (IV SOLUTION) IMPLANT
PACK BASIN DAY SURGERY FS (CUSTOM PROCEDURE TRAY) ×3 IMPLANT
PAD CAST 3X4 CTTN HI CHSV (CAST SUPPLIES) ×1 IMPLANT
PAD CAST 4YDX4 CTTN HI CHSV (CAST SUPPLIES) IMPLANT
PADDING CAST ABS 4INX4YD NS (CAST SUPPLIES) ×2
PADDING CAST ABS COTTON 4X4 ST (CAST SUPPLIES) ×1 IMPLANT
PADDING CAST COTTON 3X4 STRL (CAST SUPPLIES) ×3
PADDING CAST COTTON 4X4 STRL (CAST SUPPLIES)
PADDING UNDERCAST 2 STRL (CAST SUPPLIES) ×2
PADDING UNDERCAST 2X4 STRL (CAST SUPPLIES) ×1 IMPLANT
SHEET MEDIUM DRAPE 40X70 STRL (DRAPES) ×3 IMPLANT
SPLINT PLASTER CAST XFAST 4X15 (CAST SUPPLIES) IMPLANT
SPLINT PLASTER XTRA FAST SET 4 (CAST SUPPLIES)
STOCKINETTE 4X48 STRL (DRAPES) ×3 IMPLANT
STRIP CLOSURE SKIN 1/2X4 (GAUZE/BANDAGES/DRESSINGS) IMPLANT
SUCTION FRAZIER HANDLE 10FR (MISCELLANEOUS)
SUCTION TUBE FRAZIER 10FR DISP (MISCELLANEOUS) IMPLANT
SUT ETHILON 4 0 PS 2 18 (SUTURE) IMPLANT
SUT ETHILON 5 0 PS 2 18 (SUTURE) IMPLANT
SUT MERSILENE 4 0 P 3 (SUTURE) IMPLANT
SUT VIC AB 4-0 P-3 18XBRD (SUTURE) IMPLANT
SUT VIC AB 4-0 P3 18 (SUTURE)
SUT VICRYL 4-0 PS2 18IN ABS (SUTURE) ×3 IMPLANT
SUT VICRYL RAPIDE 4-0 (SUTURE) IMPLANT
SUT VICRYL RAPIDE 4/0 PS 2 (SUTURE) IMPLANT
SYR 10ML LL (SYRINGE) IMPLANT
SYR BULB 3OZ (MISCELLANEOUS) IMPLANT
TOWEL GREEN STERILE FF (TOWEL DISPOSABLE) ×3 IMPLANT
TUBE CONNECTING 20'X1/4 (TUBING)
TUBE CONNECTING 20X1/4 (TUBING) IMPLANT
UNDERPAD 30X30 (UNDERPADS AND DIAPERS) ×3 IMPLANT

## 2019-01-09 NOTE — Transfer of Care (Signed)
Immediate Anesthesia Transfer of Care Note  Patient: Miranda Mccoy  Procedure(s) Performed: CLOSED REDUCTION FINGER WITH PERCUTANEOUS PINNING VERSES OPEN REDUCTION INTERNAL FIXTION LEFT RING FINGER MIDDLE PHALINX (Left Finger)  Patient Location: PACU  Anesthesia Type:General  Level of Consciousness: awake, alert , oriented and patient cooperative  Airway & Oxygen Therapy: Patient Spontanous Breathing and Patient connected to face mask oxygen  Post-op Assessment: Report given to RN and Post -op Vital signs reviewed and stable  Post vital signs: Reviewed and stable  Last Vitals:  Vitals Value Taken Time  BP    Temp    Pulse 62 01/09/2019 11:13 AM  Resp 10 01/09/2019 11:13 AM  SpO2 100 % 01/09/2019 11:13 AM  Vitals shown include unvalidated device data.  Last Pain:  Vitals:   01/09/19 0822  TempSrc: Oral  PainSc: 3       Patients Stated Pain Goal: 3 (54/23/70 2301)  Complications: No apparent anesthesia complications

## 2019-01-09 NOTE — Anesthesia Postprocedure Evaluation (Signed)
Anesthesia Post Note  Patient: Miranda Mccoy  Procedure(s) Performed: CLOSED REDUCTION FINGER WITH PERCUTANEOUS PINNING VERSES OPEN REDUCTION INTERNAL FIXTION LEFT RING FINGER MIDDLE PHALINX (Left Finger)     Patient location during evaluation: PACU Anesthesia Type: General Level of consciousness: awake and alert Pain management: pain level controlled Vital Signs Assessment: post-procedure vital signs reviewed and stable Respiratory status: spontaneous breathing, nonlabored ventilation, respiratory function stable and patient connected to nasal cannula oxygen Cardiovascular status: blood pressure returned to baseline and stable Postop Assessment: no apparent nausea or vomiting Anesthetic complications: no    Last Vitals:  Vitals:   01/09/19 1200 01/09/19 1224  BP: (!) 133/91 (!) 138/98  Pulse: (!) 58 65  Resp: 16 16  Temp:  36.8 C  SpO2: 100% 100%    Last Pain:  Vitals:   01/09/19 1224  TempSrc:   PainSc: 3                  Yared Barefoot L Tamyah Cutbirth

## 2019-01-09 NOTE — Anesthesia Preprocedure Evaluation (Addendum)
Anesthesia Evaluation  Patient identified by MRN, date of birth, ID band Patient awake    Reviewed: Allergy & Precautions, NPO status , Patient's Chart, lab work & pertinent test results  History of Anesthesia Complications (+) PONV and history of anesthetic complications  Airway Mallampati: II  TM Distance: >3 FB Neck ROM: Full    Dental no notable dental hx. (+) Teeth Intact, Dental Advisory Given,    Pulmonary asthma , Current Smoker,    Pulmonary exam normal breath sounds clear to auscultation       Cardiovascular hypertension, Normal cardiovascular exam+ dysrhythmias (s/p ablation 2018) Ventricular Tachycardia  Rhythm:Regular Rate:Normal  TTE 2018 - LVEF 60-65%, mild LVH, normal wall motion, grade 1 DD with indeterminant LV filling pressure, trivial MR, normal LA size, normal IVC.   Neuro/Psych  Headaches, PSYCHIATRIC DISORDERS Anxiety    GI/Hepatic Neg liver ROS, GERD  Medicated,  Endo/Other  negative endocrine ROS  Renal/GU negative Renal ROS  negative genitourinary   Musculoskeletal negative musculoskeletal ROS (+)   Abdominal   Peds  Hematology negative hematology ROS (+)   Anesthesia Other Findings   Reproductive/Obstetrics negative OB ROS                            Anesthesia Physical Anesthesia Plan  ASA: III  Anesthesia Plan: General   Post-op Pain Management:    Induction: Intravenous  PONV Risk Score and Plan: 3 and Ondansetron, Dexamethasone and Midazolam  Airway Management Planned: LMA  Additional Equipment:   Intra-op Plan:   Post-operative Plan: Extubation in OR  Informed Consent: I have reviewed the patients History and Physical, chart, labs and discussed the procedure including the risks, benefits and alternatives for the proposed anesthesia with the patient or authorized representative who has indicated his/her understanding and acceptance.     Dental  advisory given  Plan Discussed with: CRNA  Anesthesia Plan Comments:        Anesthesia Quick Evaluation

## 2019-01-09 NOTE — Op Note (Signed)
Please see dictated report [id number]

## 2019-01-09 NOTE — Op Note (Signed)
NAME: Miranda Mccoy, Miranda Mccoy RECORD [id number] ACCOUNT [id number] DATE OF BIRTH:[birth year] FACILITY: [facility] LOCATION: [facility] PHYSICIAN:Brynden Thune A. Burney Gauze, MD  OPERATIVE REPORT  DATE OF PROCEDURE:  01/09/2019  PREOPERATIVE DIAGNOSIS:  Displaced left ring finger middle phalanx fracture.  POSTOPERATIVE DIAGNOSIS:  Displaced left ring finger middle phalanx fracture.  PROCEDURE:  Closed reduction and pinning of above fracture using 0.035 K-wires x2.  SURGEON:  Charlotte Crumb, MD  ASSISTANT:  Dasnoit, PA.  ANESTHESIA:  General.  COMPLICATIONS:  No complications.  DRAINS:  No drains.  DESCRIPTION OF PROCEDURE:  The patient was taken to the operating suite after induction of adequate general anesthetic.  The left upper extremity was prepped and draped in the usual sterile fashion.  An Esmarch was used to exsanguinate the limb and the  tourniquet was inflated to 250 mmHg.  At this point in time, a closed reduction was performed of an angulated fracture of the left ring finger middle phalanx at the DIP joint.  Closed reduction was performed and confirmed fluoroscopically.  At this point  in time, two 0.035 K-wires were driven from distal to proximal in crossed fashion across the fracture site under direct and fluoroscopic guidance.  Fluoroscopic imaging revealed adequate reduction and good placement of hardware in multiple views.  The  K-wires were cut outside the skin and bent upon themselves.  The patient was then placed in a sterile dressing of Xeroform, 4 x 4's and an ulnar gutter splint.  The patient tolerated this procedure well and went to recovery room in stable fashion.  TN/NUANCE  D:01/09/2019 T:01/09/2019 JOB:[id number]

## 2019-01-09 NOTE — Anesthesia Procedure Notes (Signed)
Procedure Name: LMA Insertion Date/Time: 01/09/2019 10:31 AM Performed by: Wanita Chamberlain, CRNA Patient Re-evaluated:Patient Re-evaluated prior to induction Oxygen Delivery Method: Circle system utilized Preoxygenation: Pre-oxygenation with 100% oxygen Induction Type: IV induction Ventilation: Mask ventilation without difficulty LMA: LMA inserted LMA Size: 4.0 Number of attempts: 1 Placement Confirmation: breath sounds checked- equal and bilateral,  CO2 detector and positive ETCO2 Tube secured with: Tape Dental Injury: Teeth and Oropharynx as per pre-operative assessment

## 2019-01-09 NOTE — Discharge Instructions (Signed)
No Tylenol until 3:00pm if needed  Post Anesthesia Home Care Instructions  Activity: Get plenty of rest for the remainder of the day. A responsible adult should stay with you for 24 hours following the procedure.  For the next 24 hours, DO NOT: -Drive a car -Paediatric nurse -Drink alcoholic beverages -Take any medication unless instructed by your physician -Make any legal decisions or sign important papers.  Meals: Start with liquid foods such as gelatin or soup. Progress to regular foods as tolerated. Avoid greasy, spicy, heavy foods. If nausea and/or vomiting occur, drink only clear liquids until the nausea and/or vomiting subsides. Call your physician if vomiting continues.  Special Instructions/Symptoms: Your throat may feel dry or sore from the anesthesia or the breathing tube placed in your throat during surgery. If this causes discomfort, gargle with warm salt water. The discomfort should disappear within 24 hours.  If you had a scopolamine patch placed behind your ear for the management of post- operative nausea and/or vomiting:  1. The medication in the patch is effective for 72 hours, after which it should be removed.  Wrap patch in a tissue and discard in the trash. Wash hands thoroughly with soap and water. 2. You may remove the patch earlier than 72 hours if you experience unpleasant side effects which may include dry mouth, dizziness or visual disturbances. 3. Avoid touching the patch. Wash your hands with soap and water after contact with the patch.

## 2019-01-09 NOTE — H&P (Signed)
Miranda Mccoy is an 33 y.o. female.   Chief Complaint: Left ring finger pain, swelling, and deformity HPI: Patient is a very pleasant 33 year old female status post fall 2 weeks ago with displaced and angulated middle phalanx fracture left ring finger  Past Medical History:  Diagnosis Date  . Allergy   . Anxiety   . Asthma   . GERD (gastroesophageal reflux disease)   . Headache(784.0)   . History of chicken pox   . Hypertension    resolved since Gastric Bypass. On no meds.  Marland Kitchen PONV (postoperative nausea and vomiting)   . Skin cancer 2007   Squamous Cell    Past Surgical History:  Procedure Laterality Date  . FRACTURE SURGERY     elbow as child  . GASTRIC ROUX-EN-Y    . V TACH ABLATION N/A 08/17/2017   Procedure: V TACH ABLATION;  Surgeon: Constance Haw, MD;  Location: [facility];  Service: Cardiovascular;  Laterality: N/A;    Family History  Problem Relation Age of Onset  . Anxiety disorder Sister   . Hyperlipidemia Maternal Grandfather   . Hypertension Maternal Grandfather   . Diabetes Maternal Grandfather   . Heart attack Maternal Grandfather   . Hyperlipidemia Paternal Grandfather   . Hypertension Paternal Grandfather   . Diabetes Paternal Grandfather   . Heart attack Paternal Grandfather   . Cancer Mother        uterine  . Diabetes Mother   . Kidney disease Mother   . Carpal tunnel syndrome Mother   . Hypertension Father   . Suicidality Father    Social History:  reports that she has been smoking cigarettes. She has never used smokeless tobacco. She reports previous drug use. She reports that she does not drink alcohol.  Allergies:  Allergies  Allergen Reactions  . Other Anaphylaxis    cucumbers  . Nsaids Other (See Comments)    Due to gastic bypass surgery 07/25/2018  . Naltrexone-Bupropion Hcl Er Anxiety    Medications Prior to Admission  Medication Sig Dispense Refill  . albuterol (PROAIR HFA) 108 (90 BASE) MCG/ACT inhaler Inhale 2  puffs into the lungs every 6 (six) hours as needed.      . budesonide-formoterol (SYMBICORT) 160-4.5 MCG/ACT inhaler Inhale 1 puff into the lungs 2 (two) times daily.      . busPIRone (BUSPAR) 10 MG tablet Take 10 mg by mouth 2 (two) times daily.     Marland Kitchen levonorgestrel (MIRENA) 20 MCG/24HR IUD 1 each by Intrauterine route once.    . montelukast (SINGULAIR) 10 MG tablet Take 10 mg by mouth daily.      Marland Kitchen omeprazole (PRILOSEC) 40 MG capsule Take 40 mg by mouth 2 (two) times daily.      No results found for this or any previous visit (from the past 48 hour(s)). No results found.  Review of Systems  All other systems reviewed and are negative.   Height 5\' 6"  (1.676 m), weight 84.8 kg. Physical Exam  Constitutional: She is oriented to person, place, and time. She appears well-developed and well-nourished.  HENT:  Head: Normocephalic and atraumatic.  Neck: Normal range of motion.  Cardiovascular: Normal rate.  Respiratory: Effort normal.  Musculoskeletal:     Left hand: She exhibits tenderness, bony tenderness and deformity.     Comments: Left ring finger displaced and angulated middle phalanx fracture with pain and swelling  Neurological: She is alert and oriented to person, place, and time.  Skin: Skin is  warm.  Psychiatric: She has a normal mood and affect. Her behavior is normal. Judgment and thought content normal.     Assessment/Plan   33 year old female right-hand-dominant with displaced left ring finger middle phalanx fracture.  Have discussed the role of closed reduction and percutaneous pinning versus open reduction internal fixation of this displaced and angulated fracture.  Patient understands the risks and benefits and wishes to proceed Schuyler Amor, MD 01/09/2019, 8:23 AM

## 2019-01-10 ENCOUNTER — Encounter (HOSPITAL_BASED_OUTPATIENT_CLINIC_OR_DEPARTMENT_OTHER): Payer: Self-pay | Admitting: Orthopedic Surgery

## 2019-01-18 IMAGING — DX DG CHEST 2V
2 series · 2 of 2 positions shown · non-contrast
Comparison: None.

CLINICAL DATA: Abnormal EKG

EXAM:
CHEST  2 VIEW

[w chest pa]
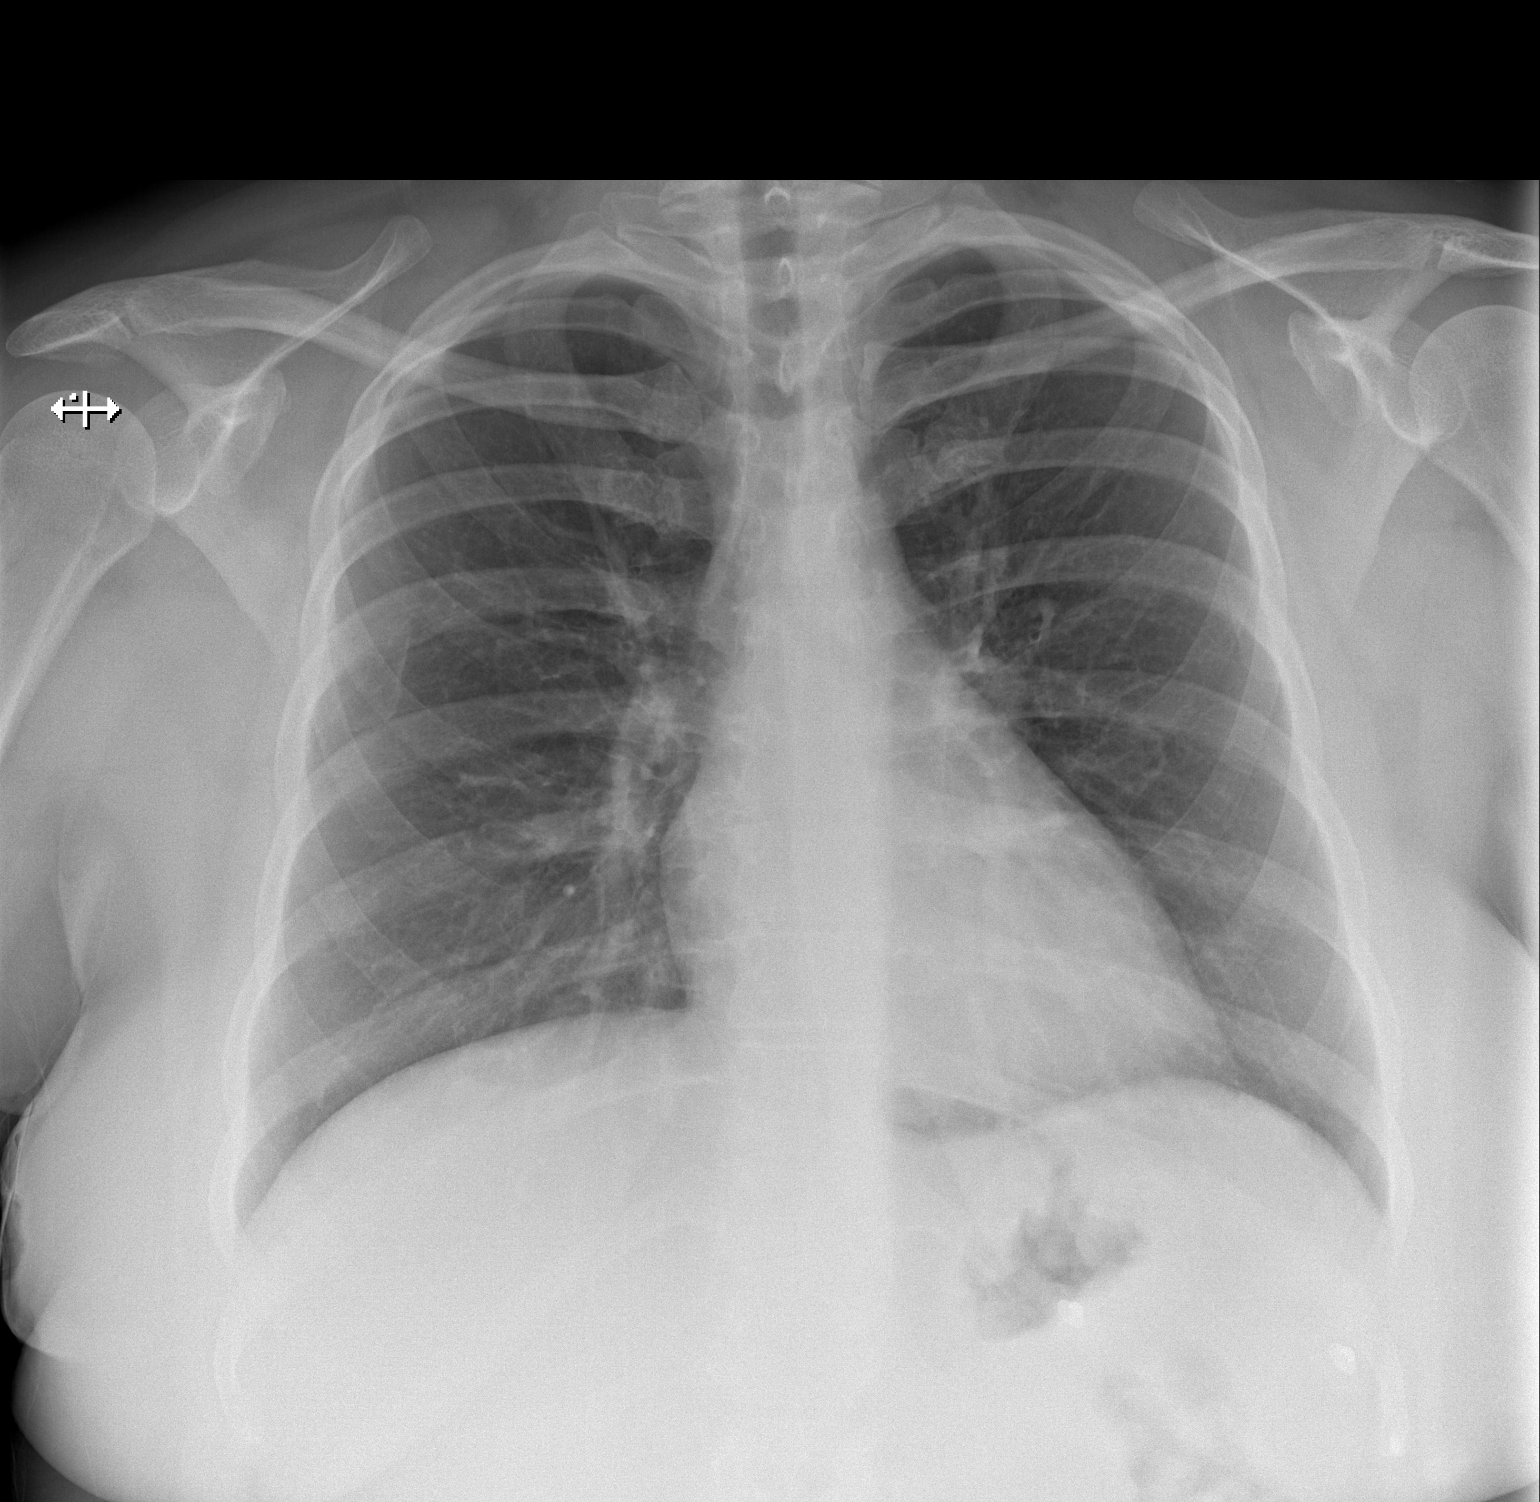

[w chest lat]
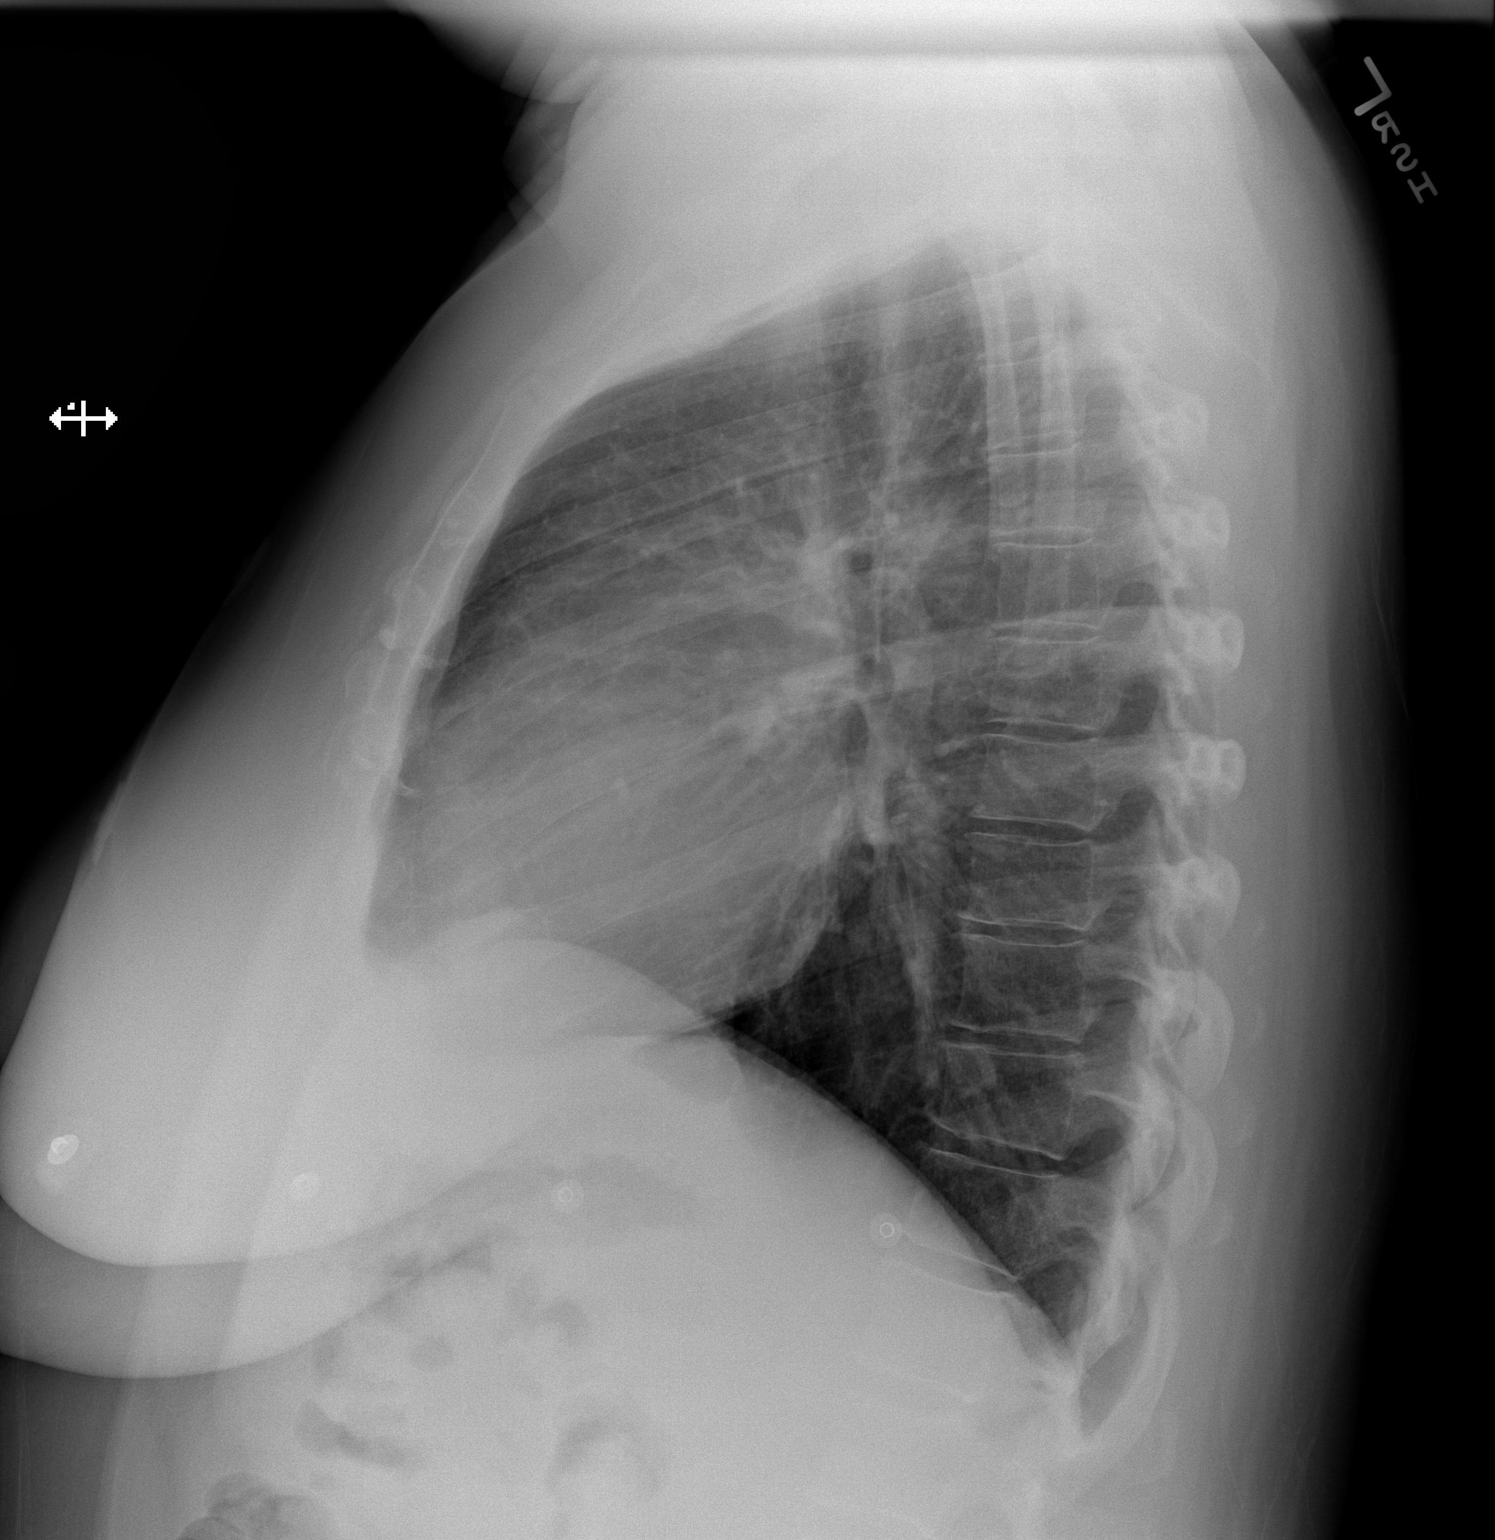

[2 of 2 positions shown; findings below may reference images not displayed]

FINDINGS: The heart size and mediastinal contours are within normal limits.
Both lungs are clear. The visualized skeletal structures are
unremarkable.
IMPRESSION: No active cardiopulmonary disease.

## 2019-05-01 ENCOUNTER — Other Ambulatory Visit: Payer: Self-pay

## 2019-05-01 ENCOUNTER — Encounter: Payer: Self-pay | Admitting: Neurology

## 2019-05-01 DIAGNOSIS — T148XXA Other injury of unspecified body region, initial encounter: Secondary | ICD-10-CM

## 2019-05-28 ENCOUNTER — Other Ambulatory Visit: Payer: Self-pay

## 2019-05-28 DIAGNOSIS — Z20822 Contact with and (suspected) exposure to covid-19: Secondary | ICD-10-CM

## 2019-05-29 LAB — NOVEL CORONAVIRUS, NAA: SARS-CoV-2, NAA: NOT DETECTED

## 2019-06-04 ENCOUNTER — Encounter: Payer: 59 | Admitting: Neurology

## 2019-06-04 HISTORY — PX: OTHER SURGICAL HISTORY: SHX169

## 2019-06-17 NOTE — Progress Notes (Signed)
NEUROLOGY CONSULTATION NOTE  Rachael Carr MRN: [id number] DOB: 08-28-1986  Referring provider: Windell Hummingbird, PA-C Primary care provider: Annetta Maw, MD  Reason for consult:  Foot drop  HISTORY OF PRESENT ILLNESS: Miranda Mccoy is a 33 year old female with who presents for left foot drop.  History supplemented by referring provider note.  Beginning in July, she noticed numbness in the left leg in the left hip down the lateral leg, not involving the foot or toes, and found it difficult to pick up her foot while walking.  No associated pain.  She denies any back pain.  Prior to this, she remembers walking down the steps and her left knee buckled.  She had a fall 3 weeks ago and broke her shoulder.  She had that fixed.  Since then, her foot weakness seems better since then.  She no longer has the numbness but still has some difficulty walking.  She had surgery on her left 4th finger after fracturing it in March.  The pin slipped after surgery.  She now has numbness in the 4th and 5th digits of the left hand.  Dr. Burney Gauze ordered NCV-EMG of upper extremities to evaluate for ulnar nerve palsy.  PAST MEDICAL HISTORY: Past Medical History:  Diagnosis Date  . Allergy   . Anxiety   . Asthma   . GERD (gastroesophageal reflux disease)   . Headache(784.0)   . History of chicken pox   . Hypertension    resolved since Gastric Bypass. On no meds.  Marland Kitchen PONV (postoperative nausea and vomiting)   . Skin cancer 2007   Squamous Cell  . Staph infection    2-3 years ago Left upper arm after having skin cancer removed    PAST SURGICAL HISTORY: Past Surgical History:  Procedure Laterality Date  . CLOSED REDUCTION FINGER WITH PERCUTANEOUS PINNING Left 01/09/2019   Procedure: CLOSED REDUCTION FINGER WITH PERCUTANEOUS PINNING VERSES OPEN REDUCTION INTERNAL FIXTION LEFT RING FINGER MIDDLE Robinson;  Surgeon: Charlotte Crumb, MD;  Location: [facility];  Service: Orthopedics;  Laterality:  Left;  . FRACTURE SURGERY     elbow as child  . GASTRIC ROUX-EN-Y    . V TACH ABLATION N/A 08/17/2017   Procedure: V TACH ABLATION;  Surgeon: Constance Haw, MD;  Location: [facility];  Service: Cardiovascular;  Laterality: N/A;    MEDICATIONS: Current Outpatient Medications on File Prior to Visit  Medication Sig Dispense Refill  . albuterol (PROAIR HFA) 108 (90 BASE) MCG/ACT inhaler Inhale 2 puffs into the lungs every 6 (six) hours as needed.      . budesonide-formoterol (SYMBICORT) 160-4.5 MCG/ACT inhaler Inhale 1 puff into the lungs 2 (two) times daily.      . busPIRone (BUSPAR) 10 MG tablet Take 10 mg by mouth 2 (two) times daily.     Marland Kitchen levonorgestrel (MIRENA) 20 MCG/24HR IUD 1 each by Intrauterine route once.    . montelukast (SINGULAIR) 10 MG tablet Take 10 mg by mouth daily.      Marland Kitchen omeprazole (PRILOSEC) 40 MG capsule Take 40 mg by mouth 2 (two) times daily.    Marland Kitchen oxyCODONE-acetaminophen (PERCOCET) 5-325 MG tablet Take 1 tablet by mouth every 4 (four) hours as needed for severe pain. 20 tablet 0   No current facility-administered medications on file prior to visit.     ALLERGIES: Allergies  Allergen Reactions  . Other Anaphylaxis    cucumbers  . Nsaids Other (See Comments)    Due to  gastic bypass surgery 07/25/2018  . Naltrexone-Bupropion Hcl Er Anxiety    FAMILY HISTORY: Family History  Problem Relation Age of Onset  . Anxiety disorder Sister   . Hyperlipidemia Maternal Grandfather   . Hypertension Maternal Grandfather   . Diabetes Maternal Grandfather   . Heart attack Maternal Grandfather   . Hyperlipidemia Paternal Grandfather   . Hypertension Paternal Grandfather   . Diabetes Paternal Grandfather   . Heart attack Paternal Grandfather   . Cancer Mother        uterine  . Diabetes Mother   . Kidney disease Mother   . Carpal tunnel syndrome Mother   . Hypertension Father   . Suicidality Father    SOCIAL HISTORY: Social History   Socioeconomic  History  . Marital status: Single    Spouse name: Not on file  . Number of children: Not on file  . Years of education: Not on file  . Highest education level: Not on file  Occupational History  . Not on file  Social Needs  . Financial resource strain: Not on file  . Food insecurity    Worry: Not on file    Inability: Not on file  . Transportation needs    Medical: Not on file    Non-medical: Not on file  Tobacco Use  . Smoking status: Light Tobacco Smoker    Types: Cigarettes  . Smokeless tobacco: Never Used  . Tobacco comment: social  Substance and Sexual Activity  . Alcohol use: No    Comment: 3x per week   . Drug use: Not Currently  . Sexual activity: Yes    Birth control/protection: I.U.D.  Lifestyle  . Physical activity    Days per week: Not on file    Minutes per session: Not on file  . Stress: Not on file  Relationships  . Social Herbalist on phone: Not on file    Gets together: Not on file    Attends religious service: Not on file    Active member of club or organization: Not on file    Attends meetings of clubs or organizations: Not on file    Relationship status: Not on file  . Intimate partner violence    Fear of current or ex partner: Not on file    Emotionally abused: Not on file    Physically abused: Not on file    Forced sexual activity: Not on file  Other Topics Concern  . Not on file  Social History Narrative   Regular exercise: yes   Emergency planning/management officer.       REVIEW OF SYSTEMS: Constitutional: No fevers, chills, or sweats, no generalized fatigue, change in appetite Eyes: No visual changes, double vision, eye pain Ear, nose and throat: No hearing loss, ear pain, nasal congestion, sore throat Cardiovascular: No chest pain, palpitations Respiratory:  No shortness of breath at rest or with exertion, wheezes GastrointestinaI: No nausea, vomiting, diarrhea, abdominal pain, fecal incontinence Genitourinary:  No dysuria,  urinary retention or frequency Musculoskeletal:  No neck pain, back pain Integumentary: No rash, pruritus, skin lesions Neurological: as above Psychiatric: No depression, insomnia, anxiety Endocrine: No palpitations, fatigue, diaphoresis, mood swings, change in appetite, change in weight, increased thirst Hematologic/Lymphatic:  No purpura, petechiae. Allergic/Immunologic: no itchy/runny eyes, nasal congestion, recent allergic reactions, rashes  PHYSICAL EXAM: Blood pressure 114/87, pulse 82, temperature 98.4 F (36.9 C), height 5\' 6"  (1.676 m), weight 161 lb (73 kg), SpO2 96 %. General: No acute distress.  Patient appears well-groomed.  Head:  Normocephalic/atraumatic Eyes:  fundi examined but not visualized Neck: supple, no paraspinal tenderness, full range of motion Back: Mild left lower lumbar paraspinal tenderness Heart: regular rate and rhythm Lungs: Clear to auscultation bilaterally. Vascular: No carotid bruits. Neurological Exam: Mental status: alert and oriented to person, place, and time, recent and remote memory intact, fund of knowledge intact, attention and concentration intact, speech fluent and not dysarthric, language intact. Cranial nerves: CN I: not tested CN II: pupils equal, round and reactive to light, visual fields intact CN III, IV, VI:  full range of motion, no nystagmus, no ptosis CN V: facial sensation intact CN VII: upper and lower face symmetric CN VIII: hearing intact CN IX, X: gag intact, uvula midline CN XI: sternocleidomastoid and trapezius muscles intact CN XII: tongue midline Bulk & Tone: normal, no fasciculations. Motor:  Right arm in sling.  4+/5 left abductor pollicis, 4+/5 left extensor hallucis longus and perhaps trace weakness in ankle dorsiflexion, otherwise 5/5 throughout including hip flexion, knee extension, plantarflexion, foot inversion and eversion.  Sensation:  Pinprick sensation reduced along lateral left foot and lateral leg below  the knee; vibration sensation intact.   Deep Tendon Reflexes:  2+ throughout, toes downgoing.   Finger to nose testing:  Without dysmetria.   Heel to shin:  Without dysmetria.   Gait:  Normal station and stride.  Able to walk on toes, unable to walk on heels.  Able to turn and tandem walk. Romberg negative.  IMPRESSION: 1.  Left foot drop.  Numbness involving the thigh raises possibility of L5 radiculopathy (she endorses sensory deficits in the S1 dermatome as well).  PLAN: 1.  NCV-EMG left lower extremity 2.  Further recommendations pending results. 3.  Advised not to cross legs or lean on elbow.  Thank you for allowing me to take part in the care of this patient.  40 minutes spent with patient over 50% spent discussing diagnosis and plan.  Metta Clines, DO  CC:  Annetta Maw, MD   Windell Hummingbird, PA-C

## 2019-06-18 ENCOUNTER — Encounter: Payer: Self-pay | Admitting: Neurology

## 2019-06-18 ENCOUNTER — Other Ambulatory Visit: Payer: Self-pay

## 2019-06-18 ENCOUNTER — Ambulatory Visit (INDEPENDENT_AMBULATORY_CARE_PROVIDER_SITE_OTHER): Payer: 59 | Admitting: Neurology

## 2019-06-18 ENCOUNTER — Telehealth: Payer: Self-pay | Admitting: *Deleted

## 2019-06-18 VITALS — BP 114/87 | HR 82 | Temp 98.4°F | Ht 66.0 in | Wt 161.0 lb

## 2019-06-18 DIAGNOSIS — M21372 Foot drop, left foot: Secondary | ICD-10-CM | POA: Diagnosis not present

## 2019-06-18 NOTE — Patient Instructions (Signed)
1.  We definitely need to get a nerve test performed on the left leg as well.  I will check with Dr. Posey Pronto to see if we can add it to the scheduled EMG of your arm.  Otherwise, we will schedule another test for the leg.   2.  Try not to cross your legs or lean on your elbow. 3.  Follow up after testing.

## 2019-06-18 NOTE — Telephone Encounter (Signed)
Patient has bilat upper arm order for EMG this Thursday. She had surgery on her right arm last week and states it is not able to be used for the EMG and that she just needs it in the left arm anyway.   Informed patient that the EMG of Left leg that Dr. Tomi Likens ordered will have to be at a separate time as different providers ordered it. Patient understands and I will have front desk call her to schedule.'  Dr Archie Endo re: arm EMG above.

## 2019-06-20 ENCOUNTER — Other Ambulatory Visit: Payer: Self-pay

## 2019-06-20 ENCOUNTER — Ambulatory Visit (INDEPENDENT_AMBULATORY_CARE_PROVIDER_SITE_OTHER): Payer: 59 | Admitting: Neurology

## 2019-06-20 DIAGNOSIS — T148XXA Other injury of unspecified body region, initial encounter: Secondary | ICD-10-CM

## 2019-06-20 DIAGNOSIS — G5622 Lesion of ulnar nerve, left upper limb: Secondary | ICD-10-CM

## 2019-06-20 NOTE — Procedures (Signed)
[facility]  [place], [place]  [place], [place] [address] Tel: [phone] Fax:  [phone] Test Date:  06/20/2019  Patient: Miranda Mccoy DOB: Mar 24, 1986 Physician: Narda Amber, DO  Sex: Female Height: 5\' 6"  Ref Phys: Charlotte Crumb, MD  ID#: [id number] Temp: 35.0C Technician:    Patient Complaints: This is a 33 year old female referred for evaluation of left hand paresthesias, involving the ulnar distribution.  NCV & EMG Findings: Extensive electrodiagnostic testing of the left upper extremity and limited evaluation of the right shows:  1. Left ulnar sensory response shows reduced amplitude (L11.3 V).  The remaining sensory responses including bilateral median, left radial, left dorsal ulnar cutaneous, and right ulnar sensory responses are within normal limits.   2. Left ulnar motor response shows markedly reduced amplitude (L3.4, L3.4 mV) and decreased conduction velocity (A Elbow-B Elbow, L42, L43 m/s).  Right ulnar motor response also shows reduced amplitude distally (R5.6 mV).  Due to immobilization of the right upper extremity, proximal ulnar and median motor responses were not obtained.  Bilateral median motor responses are within normal limits.  Of note, there is a Martin-Gruber anastomosis on the left. 3. Chronic motor axonal loss changes are seen affecting the ulnar innervated muscle on the left, with active denervation in the first dorsal interosseous muscles.  Impression: 1. 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 2. Recommend dedicated electrodiagnostic testing of the right upper extremity when patient can mobilize the arm safely, as the clinical significance of reduced ulnar motor response on the right remains unclear.   ___________________________ Narda Amber, DO    Nerve Conduction Studies Anti Sensory Summary Table   Site NR Peak (ms) Norm Peak (ms) P-T Amp (V) Norm P-T Amp   Left DorsCutan Anti Sensory (Dorsum 5th MC)  35C  Wrist    1.0 <3.1 14.7 >12  Left Median Anti Sensory (2nd Digit)  35C  Wrist    3.2 <3.4 35.5 >20  Right Median Anti Sensory (2nd Digit)  35C  Wrist    3.4 <3.4 22.1 >20  Left Radial Anti Sensory (Base 1st Digit)  35C  Wrist    1.8 <2.7 30.3 >18  Left Ulnar Anti Sensory (5th Digit)  35C  Wrist    2.6 <3.1 11.3 >12  Right Ulnar Anti Sensory (5th Digit)  35C  Wrist    2.6 <3.1 14.1 >12   Motor Summary Table   Site NR Onset (ms) Norm Onset (ms) O-P Amp (mV) Norm O-P Amp Site1 Site2 Delta-0 (ms) Dist (cm) Vel (m/s) Norm Vel (m/s)  Left Median Motor (Abd Poll Brev)  35C  Wrist    3.2 <3.9 7.0 >6 Elbow Wrist 5.2 30.0 58 >50  Elbow    8.4  7.5  Ulnar-wrist crossover Elbow 5.0 0.0    Ulnar-wrist crossover    3.4  1.1         Right Median Motor (Abd Poll Brev)  35C  Wrist    3.2 <3.9 6.4 >6        Left Ulnar Motor (Abd Dig Minimi)  35C  Wrist    2.3 <3.1 3.4 >7 B Elbow Wrist 4.7 24.0 51 >50  B Elbow    7.0  3.0  A Elbow B Elbow 2.4 10.0 42 >50  A Elbow    9.4  2.7         Right Ulnar Motor (Abd Dig Minimi)  35C  Wrist  2.0 <3.1 5.6 >7        Left Ulnar (FDI) Motor (1st DI)  35C  Wrist    3.6 <4.3 3.4 >7 B Elbow Wrist 4.6 24.0 52 >50  B Elbow    8.2  2.1  A Elbow B Elbow 2.3 10.0 43 >50  A Elbow    10.5  1.2          EMG   Side Muscle Ins Act Fibs Psw Fasc Number Recrt Dur Dur. Amp Amp. Poly Poly. Comment  Left 1stDorInt Nml 1+ Nml Nml 3- Rapid Most 1+ Most 1+ Many 1+ N/A  Left ABD Dig Min Nml Nml Nml Nml 2- Rapid Many 1+ Many 1+ Nml Nml N/A  Left FlexCarpiUln Nml Nml Nml Nml 1- Rapid Some 1+ Some 1+ Some 1+ N/A  Left Ext Indicis Nml Nml Nml Nml Nml Nml Nml Nml Nml Nml Nml Nml N/A  Left PronatorTeres Nml Nml Nml Nml Nml Nml Nml Nml Nml Nml Nml Nml N/A  Left Biceps Nml Nml Nml Nml Nml Nml Nml Nml Nml Nml Nml Nml N/A  Left Triceps Nml Nml Nml Nml Nml Nml Nml Nml Nml Nml Nml Nml N/A  Left Deltoid Nml Nml Nml Nml Nml Nml Nml  Nml Nml Nml Nml Nml N/A      Waveforms:

## 2019-07-09 ENCOUNTER — Other Ambulatory Visit: Payer: Self-pay

## 2019-07-09 DIAGNOSIS — Z20822 Contact with and (suspected) exposure to covid-19: Secondary | ICD-10-CM

## 2019-07-11 LAB — NOVEL CORONAVIRUS, NAA: SARS-CoV-2, NAA: NOT DETECTED

## 2019-07-30 ENCOUNTER — Ambulatory Visit (INDEPENDENT_AMBULATORY_CARE_PROVIDER_SITE_OTHER): Payer: 59 | Admitting: Neurology

## 2019-07-30 ENCOUNTER — Other Ambulatory Visit: Payer: Self-pay

## 2019-07-30 DIAGNOSIS — M21372 Foot drop, left foot: Secondary | ICD-10-CM | POA: Diagnosis not present

## 2019-07-30 NOTE — Procedures (Addendum)
[facility]  [place], [place]  [place], [place] [address] Tel: [phone] Fax:  [phone] Test Date:  07/30/2019  Patient: Miranda Mccoy [employer] DOB: 1986/01/16 Physician: Narda Amber, DO  Sex: Female Height: 5\' 6"  Ref Phys: Metta Clines, DO  ID#: [id number] Temp: 35.0C Technician:    Patient Complaints: This is a 33 year old female referred for evaluation of left foot drop.  NCV & EMG Findings: Extensive electrodiagnostic testing of the left lower extremity and additional studies of the right shows:  1. Bilateral sural and superficial peroneal sensory responses are within normal limits. 2. Left peroneal peroneal motor response at the extensor digitorum brevis and tibialis anterior shows reduced amplitude (L1.4, L3.7 mV).  Right peroneal and bilateral tibial motor responses are within normal limits. 3. Left tibial H reflex study is within normal limits. 4. Reduced recruitment is seen in the anterior tibialis, fibularis longus, extensor hallucis longus, and gluteus medius muscles with normal motor unit configuration.  Impression: Electrodiagnostic findings are very mild and suggestive of a chronic L5 radiculopathy affecting the left lower extremity.  There is no evidence of a left peroneal mononeuropathy.   ___________________________ Narda Amber, DO    Nerve Conduction Studies Anti Sensory Summary Table   Site NR Peak (ms) Norm Peak (ms) P-T Amp (V) Norm P-T Amp  Left Sup Peroneal Anti Sensory (Ant Lat Mall)  35C  12 cm    2.5 <4.5 6.8 >5  Right Sup Peroneal Anti Sensory (Ant Lat Mall)  35C  12 cm    3.1 <4.5 7.7 >5  Left Sural Anti Sensory (Lat Mall)  35C  Calf    2.8 <4.5 25.8 >5  Right Sural Anti Sensory (Lat Mall)  35C  Calf    3.2 <4.5 22.0 >5   Motor Summary Table   Site NR Onset (ms) Norm Onset (ms) O-P Amp (mV) Norm O-P Amp Site1 Site2 Delta-0 (ms) Dist (cm) Vel (m/s) Norm Vel (m/s)  Left Peroneal Motor (Ext Dig Brev)  35C  Ankle    4.3  <5.5 1.4 >3 B Fib Ankle 7.8 35.0 45 >40  B Fib    12.1  1.1  Poplt B Fib 2.5 8.0 42 >40  Poplt    14.6  0.9         Right Peroneal Motor (Ext Dig Brev)  35C  Ankle    3.7 <5.5 3.2 >3 B Fib Ankle 7.6 37.0 49 >40  B Fib    11.3  3.1  Poplt B Fib 1.7 8.0 47 >40  Poplt    13.0  3.1         Left Peroneal TA Motor (Tib Ant)  35C  Fib Head    3.2 <4.0 3.7 >4 Poplit Fib Head 1.3 7.0 54 >40  Poplit    4.5  3.7         Right Peroneal TA Motor (Tib Ant)  35C  Fib Head    3.5 <4.0 4.6 >4 Poplit Fib Head 1.2 7.0 58 >40  Poplit    4.7  4.5         Left Tibial Motor (Abd Hall Brev)  35C  Ankle    3.6 <6.0 12.6 >8 Knee Ankle 8.7 37.0 43 >40  Knee    12.3  9.2         Right Tibial Motor (Abd Hall Brev)  35C  Ankle    5.8 <6.0 14.5 >8 Knee Ankle 8.0 38.0 48 >40  Knee  13.8  8.3           H Reflex Studies   NR H-Lat (ms) Lat Norm (ms) L-R H-Lat (ms)  Left Tibial (Gastroc)  35C     32.65 <35    EMG   Side Muscle Ins Act Fibs Psw Fasc Number Recrt Dur Dur. Amp Amp. Poly Poly. Comment  Left AntTibialis Nml Nml Nml Nml 1- Rapid Nml Nml Nml Nml Nml Nml N/A  Left Gastroc Nml Nml Nml Nml Nml Nml Nml Nml Nml Nml Nml Nml N/A  Left Flex Dig Long Nml Nml Nml Nml Nml Nml Nml Nml Nml Nml Nml Nml N/A  Left RectFemoris Nml Nml Nml Nml Nml Nml Nml Nml Nml Nml Nml Nml N/A  Left GluteusMed Nml Nml Nml Nml 1- Rapid Nml Nml Nml Nml Nml Nml N/A  Left ExtHallLong Nml Nml Nml Nml 1- Rapid Nml Nml Nml Nml Nml Nml N/A  Left Fibularis Long Nml Nml Nml Nml 1- Rapid Nml Nml Nml Nml Nml Nml N/A  Left BicepsFemS Nml Nml Nml Nml Nml Nml Nml Nml Nml Nml Nml Nml N/A      Waveforms:

## 2019-07-31 ENCOUNTER — Telehealth: Payer: Self-pay

## 2019-07-31 DIAGNOSIS — M21372 Foot drop, left foot: Secondary | ICD-10-CM

## 2019-07-31 NOTE — Telephone Encounter (Signed)
Called spoke with patient she was made aware of results and understands. She was given the number to [facility] on [address] to call if she has not heard from them by Monday.  Order for MRI place

## 2019-07-31 NOTE — Telephone Encounter (Signed)
-----   Message from Pieter Partridge, DO sent at 07/31/2019  6:25 AM EDT ----- Nerve test doesn't reveal any convincing evidence of peroneal nerve pinching at the knee.  There may be evidence of mild pinching of a nerve in the back.  The main thing is that she has improved but I would still like to get an MRI of the lumbar spine without contrast for further evaluation of left foot drop.

## 2019-08-05 ENCOUNTER — Other Ambulatory Visit: Payer: Self-pay

## 2019-08-05 DIAGNOSIS — Z20822 Contact with and (suspected) exposure to covid-19: Secondary | ICD-10-CM

## 2019-08-07 LAB — NOVEL CORONAVIRUS, NAA: SARS-CoV-2, NAA: DETECTED — AB

## 2019-08-14 ENCOUNTER — Other Ambulatory Visit: Payer: 59

## 2019-08-31 ENCOUNTER — Other Ambulatory Visit: Payer: Self-pay

## 2019-08-31 ENCOUNTER — Ambulatory Visit
Admission: RE | Admit: 2019-08-31 | Discharge: 2019-08-31 | Disposition: A | Discharge status: Home / Self Care | Payer: 59 | Source: Ambulatory Visit | Attending: Neurology | Admitting: Neurology

## 2019-08-31 DIAGNOSIS — M21372 Foot drop, left foot: Secondary | ICD-10-CM

## 2019-09-02 ENCOUNTER — Telehealth: Payer: Self-pay

## 2019-09-02 DIAGNOSIS — M5020 Other cervical disc displacement, unspecified cervical region: Secondary | ICD-10-CM

## 2019-09-02 DIAGNOSIS — M21372 Foot drop, left foot: Secondary | ICD-10-CM

## 2019-09-02 DIAGNOSIS — M5136 Other intervertebral disc degeneration, lumbar region: Secondary | ICD-10-CM

## 2019-09-02 DIAGNOSIS — M5126 Other intervertebral disc displacement, lumbar region: Secondary | ICD-10-CM

## 2019-09-02 DIAGNOSIS — M5030 Other cervical disc degeneration, unspecified cervical region: Secondary | ICD-10-CM

## 2019-09-02 NOTE — Telephone Encounter (Signed)
-----   Message from Pieter Partridge, DO sent at 09/02/2019 12:25 PM EST ----- The MRI does show a disc bulge that may be pressing a nerve that could be causing the foot drop.  If still with weakness, we can refer her to neurosurgery.

## 2019-09-02 NOTE — Telephone Encounter (Signed)
Called patient she was informed of results and understands She agrees to referral and would sent.  Referral place to [facility] neurosurgery spine

## 2020-01-28 ENCOUNTER — Telehealth: Payer: Self-pay

## 2020-01-28 NOTE — Telephone Encounter (Signed)
Referral to dr. Jovita Gamma: Miranda Mccoy they have tried to contact pt in regards to her referral no response.  DR. Sherwood Gambler is retiring 01/31/20

## 2020-05-20 ENCOUNTER — Other Ambulatory Visit: Payer: Self-pay

## 2020-05-20 ENCOUNTER — Other Ambulatory Visit: Payer: Self-pay | Admitting: Critical Care Medicine

## 2020-05-20 DIAGNOSIS — Z20822 Contact with and (suspected) exposure to covid-19: Secondary | ICD-10-CM

## 2020-05-21 LAB — SARS-COV-2, NAA 2 DAY TAT

## 2020-05-21 LAB — NOVEL CORONAVIRUS, NAA: SARS-CoV-2, NAA: NOT DETECTED

## 2024-07-03 ENCOUNTER — Encounter: Payer: Self-pay | Admitting: Cardiology

## 2024-08-23 ENCOUNTER — Ambulatory Visit: Payer: Self-pay | Admitting: Cardiology
